# Patient Record
Sex: Female | Born: 1970 | Race: White | Hispanic: No | Marital: Married | Smoking: Former smoker
Health system: Southern US, Community
[De-identification: ages and names within clinical notes are randomized; demographics above are authoritative.]

## PROBLEM LIST (undated history)

## (undated) DIAGNOSIS — T7840XA Allergy, unspecified, initial encounter: Secondary | ICD-10-CM

## (undated) DIAGNOSIS — G43909 Migraine, unspecified, not intractable, without status migrainosus: Secondary | ICD-10-CM

## (undated) DIAGNOSIS — I10 Essential (primary) hypertension: Secondary | ICD-10-CM

## (undated) DIAGNOSIS — F329 Major depressive disorder, single episode, unspecified: Secondary | ICD-10-CM

## (undated) DIAGNOSIS — F419 Anxiety disorder, unspecified: Secondary | ICD-10-CM

## (undated) DIAGNOSIS — F32A Depression, unspecified: Secondary | ICD-10-CM

## (undated) HISTORY — PX: INCISION AND DRAINAGE ABSCESS: SHX5864

## (undated) HISTORY — PX: TONSILLECTOMY: SUR1361

## (undated) HISTORY — DX: Allergy, unspecified, initial encounter: T78.40XA

## (undated) HISTORY — PX: OTHER SURGICAL HISTORY: SHX169

## (undated) HISTORY — DX: Depression, unspecified: F32.A

## (undated) HISTORY — DX: Anxiety disorder, unspecified: F41.9

## (undated) HISTORY — DX: Major depressive disorder, single episode, unspecified: F32.9

## (undated) HISTORY — DX: Migraine, unspecified, not intractable, without status migrainosus: G43.909

## (undated) HISTORY — DX: Essential (primary) hypertension: I10

---

## 1998-10-05 ENCOUNTER — Other Ambulatory Visit: Admission: RE | Admit: 1998-10-05 | Discharge: 1998-10-05 | Payer: Self-pay | Admitting: Obstetrics and Gynecology

## 1999-05-04 ENCOUNTER — Encounter: Admission: RE | Admit: 1999-05-04 | Discharge: 1999-08-02 | Payer: Self-pay | Admitting: *Deleted

## 1999-10-14 ENCOUNTER — Other Ambulatory Visit: Admission: RE | Admit: 1999-10-14 | Discharge: 1999-10-14 | Payer: Self-pay | Admitting: Obstetrics and Gynecology

## 1999-12-30 ENCOUNTER — Encounter: Admission: RE | Admit: 1999-12-30 | Discharge: 1999-12-30 | Payer: Self-pay | Admitting: Sports Medicine

## 2000-07-10 ENCOUNTER — Encounter: Admission: RE | Admit: 2000-07-10 | Discharge: 2000-07-10 | Payer: Self-pay | Admitting: Family Medicine

## 2000-07-10 ENCOUNTER — Encounter: Payer: Self-pay | Admitting: Family Medicine

## 2000-11-22 ENCOUNTER — Other Ambulatory Visit: Admission: RE | Admit: 2000-11-22 | Discharge: 2000-11-22 | Payer: Self-pay | Admitting: Obstetrics and Gynecology

## 2001-03-07 ENCOUNTER — Encounter: Payer: Self-pay | Admitting: Obstetrics and Gynecology

## 2001-03-07 ENCOUNTER — Ambulatory Visit (HOSPITAL_COMMUNITY): Admission: RE | Admit: 2001-03-07 | Discharge: 2001-03-07 | Payer: Self-pay | Admitting: Obstetrics and Gynecology

## 2001-12-02 ENCOUNTER — Other Ambulatory Visit: Admission: RE | Admit: 2001-12-02 | Discharge: 2001-12-02 | Payer: Self-pay | Admitting: Obstetrics and Gynecology

## 2002-03-20 ENCOUNTER — Inpatient Hospital Stay (HOSPITAL_COMMUNITY): Admission: AD | Admit: 2002-03-20 | Discharge: 2002-03-22 | Payer: Self-pay | Admitting: Obstetrics and Gynecology

## 2002-03-26 ENCOUNTER — Encounter: Admission: RE | Admit: 2002-03-26 | Discharge: 2002-04-25 | Payer: Self-pay | Admitting: Obstetrics and Gynecology

## 2002-05-11 ENCOUNTER — Ambulatory Visit (HOSPITAL_COMMUNITY): Admission: RE | Admit: 2002-05-11 | Discharge: 2002-05-11 | Payer: Self-pay | Admitting: Orthopedic Surgery

## 2002-05-11 ENCOUNTER — Encounter: Payer: Self-pay | Admitting: Orthopedic Surgery

## 2002-05-23 ENCOUNTER — Ambulatory Visit (HOSPITAL_BASED_OUTPATIENT_CLINIC_OR_DEPARTMENT_OTHER): Admission: RE | Admit: 2002-05-23 | Discharge: 2002-05-23 | Payer: Self-pay | Admitting: Orthopedic Surgery

## 2002-12-29 ENCOUNTER — Other Ambulatory Visit: Admission: RE | Admit: 2002-12-29 | Discharge: 2002-12-29 | Payer: Self-pay | Admitting: Obstetrics and Gynecology

## 2004-05-04 ENCOUNTER — Other Ambulatory Visit: Admission: RE | Admit: 2004-05-04 | Discharge: 2004-05-04 | Payer: Self-pay | Admitting: Obstetrics and Gynecology

## 2005-01-11 ENCOUNTER — Observation Stay (HOSPITAL_COMMUNITY): Admission: AD | Admit: 2005-01-11 | Discharge: 2005-01-12 | Payer: Self-pay | Admitting: Otolaryngology

## 2005-02-17 ENCOUNTER — Emergency Department (HOSPITAL_COMMUNITY): Admission: EM | Admit: 2005-02-17 | Discharge: 2005-02-18 | Payer: Self-pay | Admitting: Emergency Medicine

## 2005-05-16 ENCOUNTER — Other Ambulatory Visit: Admission: RE | Admit: 2005-05-16 | Discharge: 2005-05-16 | Payer: Self-pay | Admitting: Obstetrics and Gynecology

## 2005-10-25 ENCOUNTER — Emergency Department (HOSPITAL_COMMUNITY): Admission: EM | Admit: 2005-10-25 | Discharge: 2005-10-25 | Payer: Self-pay | Admitting: Emergency Medicine

## 2006-05-25 ENCOUNTER — Other Ambulatory Visit: Admission: RE | Admit: 2006-05-25 | Discharge: 2006-05-25 | Payer: Self-pay | Admitting: Obstetrics and Gynecology

## 2006-12-06 ENCOUNTER — Inpatient Hospital Stay (HOSPITAL_COMMUNITY): Admission: AD | Admit: 2006-12-06 | Discharge: 2006-12-06 | Payer: Self-pay | Admitting: Obstetrics and Gynecology

## 2006-12-14 ENCOUNTER — Inpatient Hospital Stay (HOSPITAL_COMMUNITY): Admission: AD | Admit: 2006-12-14 | Discharge: 2006-12-16 | Payer: Self-pay | Admitting: Obstetrics and Gynecology

## 2012-05-06 ENCOUNTER — Other Ambulatory Visit: Payer: Self-pay | Admitting: Radiology

## 2012-05-06 ENCOUNTER — Encounter: Payer: Self-pay | Admitting: Obstetrics and Gynecology

## 2012-05-06 DIAGNOSIS — Z803 Family history of malignant neoplasm of breast: Secondary | ICD-10-CM

## 2012-05-06 DIAGNOSIS — N6453 Retraction of nipple: Secondary | ICD-10-CM

## 2012-05-19 ENCOUNTER — Ambulatory Visit
Admission: RE | Admit: 2012-05-19 | Discharge: 2012-05-19 | Disposition: A | Discharge status: Home / Self Care | Payer: BC Managed Care – PPO | Source: Ambulatory Visit | Attending: Radiology | Admitting: Radiology

## 2012-05-19 DIAGNOSIS — Z803 Family history of malignant neoplasm of breast: Secondary | ICD-10-CM

## 2012-05-19 DIAGNOSIS — N6453 Retraction of nipple: Secondary | ICD-10-CM

## 2012-05-19 MED ORDER — GADOBENATE DIMEGLUMINE 529 MG/ML IV SOLN
12.0000 mL | Freq: Once | INTRAVENOUS | Status: AC | PRN
  Administered 2012-05-19: 12 mL via INTRAVENOUS

## 2012-05-20 ENCOUNTER — Other Ambulatory Visit: Payer: Self-pay

## 2012-05-21 ENCOUNTER — Other Ambulatory Visit: Payer: Self-pay

## 2012-11-07 ENCOUNTER — Encounter: Payer: Self-pay | Admitting: Obstetrics and Gynecology

## 2012-11-07 ENCOUNTER — Ambulatory Visit (INDEPENDENT_AMBULATORY_CARE_PROVIDER_SITE_OTHER): Payer: BC Managed Care – PPO | Admitting: Obstetrics and Gynecology

## 2012-11-07 VITALS — BP 112/70 | Ht 62.75 in | Wt 130.0 lb

## 2012-11-07 DIAGNOSIS — F32A Depression, unspecified: Secondary | ICD-10-CM | POA: Insufficient documentation

## 2012-11-07 DIAGNOSIS — N6453 Retraction of nipple: Secondary | ICD-10-CM | POA: Insufficient documentation

## 2012-11-07 DIAGNOSIS — F419 Anxiety disorder, unspecified: Secondary | ICD-10-CM | POA: Insufficient documentation

## 2012-11-07 DIAGNOSIS — F329 Major depressive disorder, single episode, unspecified: Secondary | ICD-10-CM | POA: Insufficient documentation

## 2012-11-07 DIAGNOSIS — G43909 Migraine, unspecified, not intractable, without status migrainosus: Secondary | ICD-10-CM | POA: Insufficient documentation

## 2012-11-07 DIAGNOSIS — N6459 Other signs and symptoms in breast: Secondary | ICD-10-CM

## 2012-11-07 DIAGNOSIS — Z803 Family history of malignant neoplasm of breast: Secondary | ICD-10-CM | POA: Insufficient documentation

## 2012-11-07 NOTE — Progress Notes (Signed)
Subjective:    Heather Copeland is a 42 y.o. female, No obstetric history on file., who presents for an annual exam.    Last Pap: 11/07/2011 WNL: Yes HPV- not detected Regular Periods:yes Contraception: condoms  Monthly Breast exam:yes Tetanus<4yrs:? Nl.Bladder Function:yes Daily BMs:yes Healthy Diet:yes Calcium:no Mammogram:yes Date of Mammogram: 03/2012 Exercise:no Have often Exercise:  Seatbelt: yes Abuse at home: no Stressful work:no Sigmoid-colonoscopy: n./a Bone Density: No PCP: Dr.Avva Change in PMH: Right breast MRI-wnl-inverted nipple 04/2012 Change in FMH:no change BMI-23  History   Social History  . Marital Status: Single    Spouse Name: N/A    Number of Children: N/A  . Years of Education: N/A   Social History Main Topics  . Smoking status: Never Smoker   . Smokeless tobacco: Never Used  . Alcohol Use: None  . Drug Use: None  . Sexually Active: None   Other Topics Concern  . None   Social History Narrative  . None    Menstrual cycle:   LMP: Patient's last menstrual period was 10/24/2012.           Cycle: Fairly regular with occasional episodes of normal menses in 1 day of no bleeding and one day following.  This has occurred a few times in the last 6 months  The following portions of the patient's history were reviewed and updated as appropriate: allergies, current medications, past family history, past medical history, past social history, past surgical history and problem list.  Review of Systems Pertinent items are noted in HPI. Breast:Negative for breast lump,nipple discharge.  Nipple retraction has been worked up Gastrointestinal: Negative for abdominal pain, change in bowel habits or rectal bleeding Urinary:negative   Objective:    BP 112/70  Ht 5' 2.75" (1.594 m)  Wt 130 lb (58.968 kg)  BMI 23.21 kg/m2  LMP 10/24/2012    Weight:  Wt Readings from Last 1 Encounters:  11/07/12 130 lb (58.968 kg)          BMI: Body mass index is  23.21 kg/(m^2).  General Appearance: Alert, appropriate appearance for age. No acute distress HEENT: Grossly normal Neck / Thyroid: Supple, no masses, nodes or enlargement Lungs: clear to auscultation bilaterally Back: No CVA tenderness Breast Exam: nipple retraction / inversion, right and No masses or nodes.No dimpling, nipple retraction or discharge. Cardiovascular: Regular rate and rhythm. S1, S2, no murmur Gastrointestinal: Soft, non-tender, no masses or organomegaly Pelvic Exam: Vulva and vagina appear normal. Bimanual exam reveals normal uterus and adnexa. Rectovaginal: normal rectal, no masses Lymphatic Exam: Non-palpable nodes in neck, clavicular, axillary, or inguinal regions Skin: no rash or abnormalities Neurologic: Normal gait and speech, no tremor  Psychiatric: Alert and oriented, appropriate affect.   Wet Prep:not applicable Urinalysis:not applicable UPT: Not done   Assessment:    Persistent right nipple retraction, with a full breast workup, including MRI, but no etiology found    Plan:    Mammogram at least annually with more frequent followup as indicated pap smear due 2016 return annually or prn STD screening: declined Contraception:condoms   Dierdre Forth MD

## 2012-11-14 ENCOUNTER — Telehealth: Payer: Self-pay | Admitting: Obstetrics and Gynecology

## 2012-11-14 NOTE — Telephone Encounter (Signed)
vph pt 

## 2012-11-14 NOTE — Telephone Encounter (Signed)
Heather Copeland to address.

## 2012-12-10 ENCOUNTER — Telehealth: Payer: Self-pay

## 2012-12-10 NOTE — Telephone Encounter (Signed)
Per AVS call pt to f/u on missed mammogram appt. Pt was called today  Pt stated Mammogram was canceled b/c of child care issues. Pt stated she has already R/S mammogram for this Friday 12/13/2012.  [facility] CMA

## 2013-01-28 ENCOUNTER — Other Ambulatory Visit: Payer: Self-pay | Admitting: Neurosurgery

## 2013-01-28 DIAGNOSIS — M47812 Spondylosis without myelopathy or radiculopathy, cervical region: Secondary | ICD-10-CM

## 2013-02-08 ENCOUNTER — Other Ambulatory Visit: Payer: BC Managed Care – PPO

## 2013-02-15 ENCOUNTER — Ambulatory Visit
Admission: RE | Admit: 2013-02-15 | Discharge: 2013-02-15 | Disposition: A | Discharge status: Home / Self Care | Payer: BC Managed Care – PPO | Source: Ambulatory Visit | Attending: Neurosurgery | Admitting: Neurosurgery

## 2013-02-15 DIAGNOSIS — M47812 Spondylosis without myelopathy or radiculopathy, cervical region: Secondary | ICD-10-CM

## 2014-08-31 ENCOUNTER — Encounter: Payer: Self-pay | Admitting: Obstetrics and Gynecology

## 2014-11-01 ENCOUNTER — Ambulatory Visit (INDEPENDENT_AMBULATORY_CARE_PROVIDER_SITE_OTHER): Payer: BC Managed Care – PPO | Admitting: Physician Assistant

## 2014-11-01 VITALS — BP 118/79 | HR 80 | Temp 97.9°F | Resp 19 | Ht 63.0 in | Wt 122.6 lb

## 2014-11-01 DIAGNOSIS — H8112 Benign paroxysmal vertigo, left ear: Secondary | ICD-10-CM

## 2014-11-01 MED ORDER — MECLIZINE HCL 32 MG PO TABS: 32.0000 mg | ORAL_TABLET | Freq: Three times a day (TID) | ORAL | Status: DC | PRN

## 2014-11-01 NOTE — Progress Notes (Signed)
Subjective:    Patient ID: Heather Copeland, female    DOB: 03-Jun-1971, 44 y.o.   MRN: [id number]  PCP: Tivis Ringer, MD  Chief Complaint  Patient presents with  . Dizziness    since 5am this am, not any better after sleeping for a while, has taken 25mg  phenergan, but has not helped   . Headache  . Ear Fullness    both ears   Patient Active Problem List   Diagnosis Date Noted  . Family history of breast cancer 11/07/2012  . Nipple retraction 11/07/2012  . Migraines   . Anxiety   . Depression    Prior to Admission medications   Medication Sig Start Date End Date Taking? Authorizing Provider  b complex vitamins tablet Take 1 tablet by mouth daily.   Yes Historical Provider, MD  fluticasone (FLONASE) 50 MCG/ACT nasal spray Place 2 sprays into the nose daily.   Yes Historical Provider, MD  Loratadine (CLARITIN PO) Take 1 tablet by mouth daily.   Yes Historical Provider, MD  ramipril (ALTACE) 2.5 MG tablet Take 2.5 mg by mouth daily.   Yes Historical Provider, MD  SUMAtriptan (IMITREX) 100 MG tablet Take 100 mg by mouth every 2 (two) hours as needed.   Yes Historical Provider, MD  meclizine (ANTIVERT) 32 MG tablet Take 1 tablet (32 mg total) by mouth 3 (three) times daily as needed. 11/01/14   Araceli Bouche, PA   Medications, allergies, past medical history, surgical history, family history, social history and problem list reviewed and updated.  HPI  72 yof with no pertinent pmh presents to clinic with sudden onset vertigo that began at 5am today upon awakening.   Vertigo immediately after waking this am. Was barely able to get out of bed to use restroom. Eventually made it back to bed and went back to sleep until 8am. Vertigo has mostly persisted since then. It does get better or worse depending on certain head positions but is always somewhat there. She took a 25 mg phenergan which didn't seem to help. She has felt as if the room was spinning during episodes. She did not feel  weak or lightheaded.   No assoc N/V, vision changes, weakness, numbness, CP, SOB, palps. No recent uri sx, no assoc tinnitus or hearing loss. She has eaten today and is holding food down.   Dad had hx cva at age 103. Pt had mri at that time (in her 60s). No malformations or abnormalities per pt.   Review of Systems No fever, chills.     Objective:   Physical Exam  Constitutional: She is oriented to person, place, and time. She appears well-developed and well-nourished.  Non-toxic appearance. She does not have a sickly appearance. She does not appear ill. No distress.  BP 118/79 mmHg  Pulse 80  Temp(Src) 97.9 F (36.6 C) (Oral)  Resp 19  Ht 5\' 3"  (1.6 m)  Wt 122 lb 9.6 oz (55.611 kg)  BMI 21.72 kg/m2  SpO2 100%   HENT:  Right Ear: Tympanic membrane normal.  Left Ear: Tympanic membrane normal.  Eyes: Conjunctivae and EOM are normal. Pupils are equal, round, and reactive to light.  Neck: Normal range of motion.  Cardiovascular: Normal rate, regular rhythm, S1 normal, S2 normal and normal heart sounds.  Exam reveals no gallop.   No murmur heard. Pulmonary/Chest: Effort normal and breath sounds normal. No respiratory distress.  Neurological: She is alert and oriented to person, place, and time. She has normal strength.  No cranial nerve deficit or sensory deficit. She displays a negative Romberg sign. Coordination and gait normal.  Reflex Scores:      Patellar reflexes are 2+ on the right side and 2+ on the left side. Skin: Skin is warm, dry and intact. No rash noted.  Psychiatric: She has a normal mood and affect. Her speech is normal.      Assessment & Plan:   85 yof with no pertinent pmh presents to clinic with sudden onset vertigo that began at 5am today upon awakening.   Benign paroxysmal positional vertigo, left - Plan: meclizine (ANTIVERT) 32 MG tablet --doubt cva/tia with normal neuro exam  --doubt meneires with lack of tinnitus or hearing loss --most likely bppv as sx  have been intermittent since onset earlier this am, benign exam today, no recent uri to suggest vestibular neuritis, and reproducible sx with dix-halpike testing --info on epley maneuver --meclizine tid for 2-3 days, then prn --rtc 5 days if sx not resolved for further workup  Julieta Gutting, PA-C Physician Assistant-Certified [facility]  11/01/2014 3:09 PM

## 2014-11-01 NOTE — Patient Instructions (Signed)
Please take the meclizine three times per day for the 1st 2-3 days. This should resolve your vertigo. After that please take the medicine as needed up to three times daily. If your symptoms are still present in 5 days please return to clinic for further evaluation.   Epley Maneuver Self-Care WHAT IS THE EPLEY MANEUVER? The Epley maneuver is an exercise you can do to relieve symptoms of benign paroxysmal positional vertigo (BPPV). This condition is often just referred to as vertigo. BPPV is caused by the movement of tiny crystals (canaliths) inside your inner ear. The accumulation and movement of canaliths in your inner ear causes a sudden spinning sensation (vertigo) when you move your head to certain positions. Vertigo usually lasts about 30 seconds. BPPV usually occurs in just one ear. If you get vertigo when you lie on your left side, you probably have BPPV in your left ear. Your health care provider can tell you which ear is involved.  BPPV may be caused by a head injury. Many people older than 50 get BPPV for unknown reasons. If you have been diagnosed with BPPV, your health care provider may teach you how to do this maneuver. BPPV is not life threatening (benign) and usually goes away in time.  WHEN SHOULD I PERFORM THE EPLEY MANEUVER? You can do this maneuver at home whenever you have symptoms of vertigo. You may do the Epley maneuver up to 3 times a day until your symptoms of vertigo go away. HOW SHOULD I DO THE EPLEY MANEUVER? 1. Sit on the edge of a bed or table with your back straight. Your legs should be extended or hanging over the edge of the bed or table.  2. Turn your head halfway toward the affected ear.  3. Lie backward quickly with your head turned until you are lying flat on your back. You may want to position a pillow under your shoulders.  4. Hold this position for 30 seconds. You may experience an attack of vertigo. This is normal. Hold this position until the vertigo  stops. 5. Then turn your head to the opposite direction until your unaffected ear is facing the floor.  6. Hold this position for 30 seconds. You may experience an attack of vertigo. This is normal. Hold this position until the vertigo stops. 7. Now turn your whole body to the same side as your head. Hold for another 30 seconds.  8. You can then sit back up. ARE THERE RISKS TO THIS MANEUVER? In some cases, you may have other symptoms (such as changes in your vision, weakness, or numbness). If you have these symptoms, stop doing the maneuver and call your health care provider. Even if doing these maneuvers relieves your vertigo, you may still have dizziness. Dizziness is the sensation of light-headedness but without the sensation of movement. Even though the Epley maneuver may relieve your vertigo, it is possible that your symptoms will return within 5 years. WHAT SHOULD I DO AFTER THIS MANEUVER? After doing the Epley maneuver, you can return to your normal activities. Ask your doctor if there is anything you should do at home to prevent vertigo. This may include:  Sleeping with two or more pillows to keep your head elevated.  Not sleeping on the side of your affected ear.  Getting up slowly from bed.  Avoiding sudden movements during the day.  Avoiding extreme head movement, like looking up or bending over.  Wearing a cervical collar to prevent sudden head movements. WHAT SHOULD  I DO IF MY SYMPTOMS GET WORSE? Call your health care provider if your vertigo gets worse. Call your provider right way if you have other symptoms, including:   Nausea.  Vomiting.  Headache.  Weakness.  Numbness.  Vision changes. Document Released: 10/21/2013 Document Reviewed: 10/21/2013 [facility] Patient Information 2015 [place], [place]. This information is not intended to replace advice given to you by your health care provider. Make sure you discuss any questions you have with your health care  provider. Benign Positional Vertigo Vertigo means you feel like you or your surroundings are moving when they are not. Benign positional vertigo is the most common form of vertigo. Benign means that the cause of your condition is not serious. Benign positional vertigo is more common in older adults. CAUSES  Benign positional vertigo is the result of an upset in the labyrinth system. This is an area in the middle ear that helps control your balance. This may be caused by a viral infection, head injury, or repetitive motion. However, often no specific cause is found. SYMPTOMS  Symptoms of benign positional vertigo occur when you move your head or eyes in different directions. Some of the symptoms may include: 9. Loss of balance and falls. 10. Vomiting. 11. Blurred vision. 12. Dizziness. 13. Nausea. 14. Involuntary eye movements (nystagmus). DIAGNOSIS  Benign positional vertigo is usually diagnosed by physical exam. If the specific cause of your benign positional vertigo is unknown, your caregiver may perform imaging tests, such as magnetic resonance imaging (MRI) or computed tomography (CT). TREATMENT  Your caregiver may recommend movements or procedures to correct the benign positional vertigo. Medicines such as meclizine, benzodiazepines, and medicines for nausea may be used to treat your symptoms. In rare cases, if your symptoms are caused by certain conditions that affect the inner ear, you may need surgery. HOME CARE INSTRUCTIONS   Follow your caregiver's instructions.  Move slowly. Do not make sudden body or head movements.  Avoid driving.  Avoid operating heavy machinery.  Avoid performing any tasks that would be dangerous to you or others during a vertigo episode.  Drink enough fluids to keep your urine clear or pale yellow. SEEK IMMEDIATE MEDICAL CARE IF:   You develop problems with walking, weakness, numbness, or using your arms, hands, or legs.  You have difficulty  speaking.  You develop severe headaches.  Your nausea or vomiting continues or gets worse.  You develop visual changes.  Your family or friends notice any behavioral changes.  Your condition gets worse.  You have a fever.  You develop a stiff neck or sensitivity to light. MAKE SURE YOU:   Understand these instructions.  Will watch your condition.  Will get help right away if you are not doing well or get worse. Document Released: 07/24/2006 Document Revised: 01/08/2012 Document Reviewed: 07/06/2011 [facility] Patient Information 2015 [place], [place]. This information is not intended to replace advice given to you by your health care provider. Make sure you discuss any questions you have with your health care provider.

## 2014-11-14 ENCOUNTER — Ambulatory Visit (INDEPENDENT_AMBULATORY_CARE_PROVIDER_SITE_OTHER): Payer: BC Managed Care – PPO | Admitting: Family Medicine

## 2014-11-14 ENCOUNTER — Encounter: Payer: Self-pay | Admitting: Family Medicine

## 2014-11-14 VITALS — BP 140/76 | HR 100 | Temp 98.1°F | Resp 16

## 2014-11-14 DIAGNOSIS — R079 Chest pain, unspecified: Secondary | ICD-10-CM

## 2014-11-14 DIAGNOSIS — F410 Panic disorder [episodic paroxysmal anxiety] without agoraphobia: Secondary | ICD-10-CM

## 2014-11-14 NOTE — Progress Notes (Signed)
° °  Subjective:    Patient ID: Heather Copeland, female    DOB: [birth year], 44 y.o.   MRN: [id number] This chart was scribed for Heather Haber, Copeland by Marti Sleigh, Medical Scribe. This patient was seen in Room 7 and the patient's care was started a 2:36 PM.  Chief Complaint  Patient presents with   numbess and tingling in left arm    worried about heart.  also took a quarter of husbands 95m vailum   pressure in back    HPI HPI Comments: LVICTORINA KABLEis a 44y.o. female [facility] very anxious, flushed, dizzy, panicked and nauseous, as well as having numbness in her left arm. Pt states that she was sitting in her car this morning and became extremely anxious and flushed, and experienced numbness in her left arm. Pt states she went out to lunch with her mother and felt less anxious, but the numbness in her arm continued so [facility] Pt states she has had additional stressors at home, including a husband with a chronic illness (DM) and financial stressors. Pt reports a family hx of coronary disease, and stroke, and states that she is worried about her heart. Pt reports that she has a twin sister with anxiety problems.   Pt states she met with her PCP (Dr. ARadene Gunning several days ago for these sx  and during that meeting they discussed the possibility that she was having panic attacks. Pt denies current CP or SOB. Pt states she experienced SOB last night during one of these attacks. Pt denies leg swelling or pain. Pt states she took 1/4 valium earlier today while she was having an attack. Pt endorses having chronic migraines. Pt states she has been on prozac in the past (two years ago), and was restarted on prozac this week.    Review of Systems  Constitutional: Negative for fever and chills.  Respiratory: Negative for shortness of breath.   Cardiovascular: Negative for chest pain.  Neurological: Positive for dizziness, numbness  and headaches.       Objective:   Physical Exam  Constitutional: She is oriented to person, place, and time. She appears well-developed and well-nourished.  HENT:  Head: Normocephalic and atraumatic.  Eyes: Pupils are equal, round, and reactive to light.  Neck: Neck supple.  Cardiovascular: Normal rate and regular rhythm.   Pulmonary/Chest: Effort normal and breath sounds normal. No respiratory distress.  Neurological: She is alert and oriented to person, place, and time.  Skin: Skin is warm and dry.  Nursing note and vitals reviewed.  Long conversation with patient about recent panic attacks and chronic anxiety.     EKG: Normal sinus rhythm Assessment & Plan:   This chart was scribed in my presence and reviewed by me personally.    ICD-9-CM ICD-10-CM   1. Chest pain, unspecified chest pain type 786.50 R07.9 EKG 12-Lead     Comprehensive metabolic panel     T4, Free     TSH  2. Panic disorder 300.01 F41.0 Comprehensive metabolic panel     T4, Free     TSH   I have advised patient to increase her Prozac to 20 mg daily and follow-up with Dr. DLauna Flight  Signed, Heather Copeland

## 2014-11-14 NOTE — Addendum Note (Signed)
Addended by: Kem Boroughs D on: 11/14/2014 03:22 PM   Modules accepted: Level of Service

## 2014-11-14 NOTE — Patient Instructions (Signed)
Please discuss this with Dr. Launa Flight    Panic Attacks Panic attacks are sudden, short-livedsurges of severe anxiety, fear, or discomfort. They may occur for no reason when you are relaxed, when you are anxious, or when you are sleeping. Panic attacks may occur for a number of reasons:   Healthy people occasionally have panic attacks in extreme, life-threatening situations, such as war or natural disasters. Normal anxiety is a protective mechanism of the body that helps [place] react to danger (fight or flight response).  Panic attacks are often seen with anxiety disorders, such as panic disorder, social anxiety disorder, generalized anxiety disorder, and phobias. Anxiety disorders cause excessive or uncontrollable anxiety. They may interfere with your relationships or other life activities.  Panic attacks are sometimes seen with other mental illnesses, such as depression and posttraumatic stress disorder.  Certain medical conditions, prescription medicines, and drugs of abuse can cause panic attacks. SYMPTOMS  Panic attacks start suddenly, peak within 20 minutes, and are accompanied by four or more of the following symptoms:  Pounding heart or fast heart rate (palpitations).  Sweating.  Trembling or shaking.  Shortness of breath or feeling smothered.  Feeling choked.  Chest pain or discomfort.  Nausea or strange feeling in your stomach.  Dizziness, light-headedness, or feeling like you will faint.  Chills or hot flushes.  Numbness or tingling in your lips or hands and feet.  Feeling that things are not real or feeling that you are not yourself.  Fear of losing control or going crazy.  Fear of dying. Some of these symptoms can mimic serious medical conditions. For example, you may think you are having a heart attack. Although panic attacks can be very scary, they are not life threatening. DIAGNOSIS  Panic attacks are diagnosed through an assessment by your health care  provider. Your health care provider will ask questions about your symptoms, such as where and when they occurred. Your health care provider will also ask about your medical history and use of alcohol and drugs, including prescription medicines. Your health care provider may order blood tests or other studies to rule out a serious medical condition. Your health care provider may refer you to a mental health professional for further evaluation. TREATMENT   Most healthy people who have one or two panic attacks in an extreme, life-threatening situation will not require treatment.  The treatment for panic attacks associated with anxiety disorders or other mental illness typically involves counseling with a mental health professional, medicine, or a combination of both. Your health care provider will help determine what treatment is best for you.  Panic attacks due to physical illness usually go away with treatment of the illness. If prescription medicine is causing panic attacks, talk with your health care provider about stopping the medicine, decreasing the dose, or substituting another medicine.  Panic attacks due to alcohol or drug abuse go away with abstinence. Some adults need professional help in order to stop drinking or using drugs. HOME CARE INSTRUCTIONS   Take all medicines as directed by your health care provider.   Schedule and attend follow-up visits as directed by your health care provider. It is important to keep all your appointments. SEEK MEDICAL CARE IF:  You are not able to take your medicines as prescribed.  Your symptoms do not improve or get worse. SEEK IMMEDIATE MEDICAL CARE IF:   You experience panic attack symptoms that are different than your usual symptoms.  You have serious thoughts about hurting yourself or  others.  You are taking medicine for panic attacks and have a serious side effect. MAKE SURE YOU:  Understand these instructions.  Will watch your  condition.  Will get help right away if you are not doing well or get worse. Document Released: 10/16/2005 Document Revised: 10/21/2013 Document Reviewed: 05/30/2013 [facility] Patient Information 2015 [place], [place]. This information is not intended to replace advice given to you by your health care provider. Make sure you discuss any questions you have with your health care provider.

## 2014-11-15 LAB — COMPREHENSIVE METABOLIC PANEL
ALT: 21 U/L (ref 0–35)
AST: 26 U/L (ref 0–37)
Albumin: 4.1 g/dL (ref 3.5–5.2)
Alkaline Phosphatase: 56 U/L (ref 39–117)
BUN: 17 mg/dL (ref 6–23)
CO2: 25 mEq/L (ref 19–32)
Calcium: 9.4 mg/dL (ref 8.4–10.5)
Chloride: 103 mEq/L (ref 96–112)
Creat: 0.8 mg/dL (ref 0.50–1.10)
Glucose, Bld: 90 mg/dL (ref 70–99)
Potassium: 4.3 mEq/L (ref 3.5–5.3)
Sodium: 137 mEq/L (ref 135–145)
Total Bilirubin: 0.3 mg/dL (ref 0.2–1.2)
Total Protein: 7.5 g/dL (ref 6.0–8.3)

## 2014-11-15 LAB — T4, FREE: Free T4: 0.89 ng/dL (ref 0.80–1.80)

## 2014-11-15 LAB — TSH: TSH: 0.836 u[IU]/mL (ref 0.350–4.500)

## 2016-01-23 ENCOUNTER — Ambulatory Visit (INDEPENDENT_AMBULATORY_CARE_PROVIDER_SITE_OTHER): Payer: BC Managed Care – PPO | Admitting: Physician Assistant

## 2016-01-23 VITALS — BP 124/82 | HR 72 | Temp 98.2°F | Resp 18

## 2016-01-23 DIAGNOSIS — S61211A Laceration without foreign body of left index finger without damage to nail, initial encounter: Secondary | ICD-10-CM

## 2016-01-23 DIAGNOSIS — S61219A Laceration without foreign body of unspecified finger without damage to nail, initial encounter: Secondary | ICD-10-CM

## 2016-01-23 NOTE — Progress Notes (Signed)
   01/23/2016 11:19 AM   DOB: 1971-09-16 / MRN: [id number]  SUBJECTIVE:  Heather Copeland is a 45 y.o. female presenting for a laceration to the left index finger that occurred while slinicng some herbs this morning.  Denies change in function of the finger.  Has had a difficulty time achieving hemostasis.  She does not take blood thinners.      She is allergic to sulfa antibiotics.   She  has a past medical history of Migraines; Anxiety; Depression; Allergy; and Migraines.    She  reports that she has never smoked. She has never used smokeless tobacco. She  has no sexual activity history on file. The patient  has past surgical history that includes Disc fusion (3 yrs ago); Tonsillectomy (35yrs ago); Incision and drainage abscess; and C yst removal (10 yrs ago).  Her family history includes Anxiety disorder in her mother and sister; Breast cancer in her maternal grandmother; Depression in her mother and sister; Heart attack in her father; Heart disease in her maternal grandmother; Hypertension in her mother; Stroke in her maternal grandfather; Stroke (age of onset: 32) in her father.  Review of Systems  Constitutional: Negative for fever.  Gastrointestinal: Negative for nausea.  Skin: Negative for rash.  Neurological: Negative for dizziness and headaches.  Psychiatric/Behavioral: Negative for depression.    Problem list and medications reviewed and updated by myself where necessary, and exist elsewhere in the encounter.   OBJECTIVE:  BP 124/82 mmHg  Pulse 72  Temp(Src) 98.2 F (36.8 C) (Oral)  Resp 18  SpO2 96%  LMP 12/27/2015 (Approximate)  Physical Exam  Constitutional: She is oriented to person, place, and time. She appears well-developed and well-nourished.  Cardiovascular: Normal rate and regular rhythm.   Pulmonary/Chest: Effort normal and breath sounds normal.  Musculoskeletal:       Hands: Neurological: She is alert and oriented to person, place, and time.   Risk and  benefits discussed and verbal consent obtained. Anesthetic allergies reviewed. Patient anesthetized via digital nerve block using 1:1 mix of 2% lidocaine without epi and Marcaine. The wound was cleansed thoroughly with soap and water. Sterile prep and drape. Wound closed with 4 SI throws using 5-0 Ethilon suture material. Hemostasis achieved. Mupirocin applied to the wound and bandage placed. The patient tolerated well. Wound instructions were provided and the patient is to return in 10 days for suture removal.   No results found for this or any previous visit (from the past 72 hour(s)).  No results found.  ASSESSMENT AND PLAN  Heather Copeland was seen today for laceration.  Diagnoses and all orders for this visit:  Finger laceration, initial encounter: Repaired without complication. Anticipatory guidance provided via AVS.  RTC in 10 days for suture removal. She reports she her TDAP is current through her PCP. I have advised that she call and check on this tomorrow.      The patient was advised to call or return to clinic if she does not see an improvement in symptoms or to seek the care of the closest emergency department if she worsens with the above plan.   Philis Fendt, MHS, PA-C [facility] 01/23/2016 11:19 AM

## 2016-01-23 NOTE — Patient Instructions (Addendum)
  Take 600 mg Ibuprofen every 6-8 hours for pain as needed.  Please leave the bandage on for 48 hours. If you have exquisite pain, pus, redness or streaking up the hand and arm please call the office. Please return in ten days for suture removal.  Please check with your primary care doctor about your last tetanus shot.     IF you received an x-ray today, you will receive an invoice from [facility]. Please contact [facility] at [phone] with questions or concerns regarding your invoice.   IF you received labwork today, you will receive an invoice from [employer]. Please contact [facility] at [phone] with questions or concerns regarding your invoice.   Our billing staff will not be able to assist you with questions regarding bills from these companies.  You will be contacted with the lab results as soon as they are available. The fastest way to get your results is to activate your My Chart account. Instructions are located on the last page of this paperwork. If you have not heard from [place] regarding the results in 2 weeks, please contact this office.

## 2016-01-26 ENCOUNTER — Ambulatory Visit (INDEPENDENT_AMBULATORY_CARE_PROVIDER_SITE_OTHER): Payer: BC Managed Care – PPO | Admitting: Family Medicine

## 2016-01-26 ENCOUNTER — Telehealth: Payer: Self-pay

## 2016-01-26 VITALS — BP 118/68 | HR 81 | Temp 98.1°F | Resp 16 | Ht 63.0 in | Wt 122.0 lb

## 2016-01-26 DIAGNOSIS — L089 Local infection of the skin and subcutaneous tissue, unspecified: Secondary | ICD-10-CM

## 2016-01-26 DIAGNOSIS — T148XXA Other injury of unspecified body region, initial encounter: Principal | ICD-10-CM

## 2016-01-26 DIAGNOSIS — Z23 Encounter for immunization: Secondary | ICD-10-CM

## 2016-01-26 DIAGNOSIS — S61201A Unspecified open wound of left index finger without damage to nail, initial encounter: Secondary | ICD-10-CM | POA: Diagnosis not present

## 2016-01-26 MED ORDER — AMOXICILLIN-POT CLAVULANATE 875-125 MG PO TABS
1.0000 | ORAL_TABLET | Freq: Two times a day (BID) | ORAL | Status: DC
Start: 2016-01-26 — End: 2021-02-18

## 2016-01-26 NOTE — Telephone Encounter (Signed)
Pt needs to talk with someone about her stitches she recently got -she believes now that her finger is infected  Best number [phone]

## 2016-01-26 NOTE — Progress Notes (Signed)
45 yo woman who cut left index finger on a kitchen knife on Sunday and today, after suture repair on Sunday, she awoke with distal phalanx being red and very tender.  She had kept the finger bandaged until last night  She works at [employer] last dT.  She called her PCP Dr. Dagmar Hait to see about this.  Objective:  BP 118/68 mmHg  Pulse 81  Temp(Src) 98.1 F (36.7 C) (Oral)  Resp 16  Ht 5\' 3"  (1.6 m)  Wt 122 lb (55.339 kg)  BMI 21.62 kg/m2  SpO2 98%  LMP 12/27/2015 (Approximate) Distal phalanx shows bright red erythema the entire distal phalanx of pointer finger.  Sutures intact with no fluctuance or drainage.  Assessment:  Wound infection  Plan:  Post-traumatic wound infection ([place]) - Plan: amoxicillin-clavulanate (AUGMENTIN) 875-125 MG tablet, Tdap vaccine greater than or equal to 7yo IM  Robyn Haber,  MD

## 2016-01-26 NOTE — Patient Instructions (Addendum)
Return on Saturday, January 29, 2016  Tetanus shot given today  Take the Augmentin with food and continue the probiotic.

## 2016-01-26 NOTE — Telephone Encounter (Signed)
Pt believes that her finger has gotten infected from having stitches recently And was told to call  Best number [phone]

## 2016-01-26 NOTE — Telephone Encounter (Signed)
Pt came in.

## 2017-03-01 ENCOUNTER — Ambulatory Visit: Payer: BC Managed Care – PPO | Admitting: Sports Medicine

## 2019-06-09 ENCOUNTER — Other Ambulatory Visit: Payer: Self-pay | Admitting: Neurosurgery

## 2019-06-09 DIAGNOSIS — S129XXA Fracture of neck, unspecified, initial encounter: Secondary | ICD-10-CM

## 2019-06-19 ENCOUNTER — Other Ambulatory Visit: Payer: Self-pay

## 2019-06-19 ENCOUNTER — Ambulatory Visit
Admission: RE | Admit: 2019-06-19 | Discharge: 2019-06-19 | Disposition: A | Discharge status: Home / Self Care | Payer: BC Managed Care – PPO | Source: Ambulatory Visit | Attending: Neurosurgery | Admitting: Neurosurgery

## 2019-06-19 DIAGNOSIS — S129XXA Fracture of neck, unspecified, initial encounter: Secondary | ICD-10-CM

## 2021-01-11 ENCOUNTER — Encounter: Payer: Self-pay | Admitting: Gastroenterology

## 2021-02-18 ENCOUNTER — Other Ambulatory Visit: Payer: Self-pay

## 2021-02-18 ENCOUNTER — Ambulatory Visit (AMBULATORY_SURGERY_CENTER): Payer: Self-pay

## 2021-02-18 VITALS — Ht 62.0 in | Wt 128.6 lb

## 2021-02-18 DIAGNOSIS — Z1211 Encounter for screening for malignant neoplasm of colon: Secondary | ICD-10-CM

## 2021-02-18 NOTE — Progress Notes (Signed)

## 2021-03-03 ENCOUNTER — Encounter: Payer: Self-pay | Admitting: Gastroenterology

## 2021-03-04 ENCOUNTER — Ambulatory Visit (AMBULATORY_SURGERY_CENTER): Payer: BC Managed Care – PPO | Admitting: Gastroenterology

## 2021-03-04 ENCOUNTER — Encounter: Payer: Self-pay | Admitting: Gastroenterology

## 2021-03-04 ENCOUNTER — Other Ambulatory Visit: Payer: Self-pay

## 2021-03-04 VITALS — BP 128/80 | HR 71 | Temp 96.8°F | Resp 20 | Ht 62.0 in | Wt 128.6 lb

## 2021-03-04 DIAGNOSIS — Z1211 Encounter for screening for malignant neoplasm of colon: Secondary | ICD-10-CM

## 2021-03-04 DIAGNOSIS — D122 Benign neoplasm of ascending colon: Secondary | ICD-10-CM

## 2021-03-04 MED ORDER — SODIUM CHLORIDE 0.9 % IV SOLN: 500.0000 mL | Freq: Once | INTRAVENOUS | Status: DC

## 2021-03-04 NOTE — Op Note (Signed)
[facility] Patient Name: Heather Copeland Procedure Date: 03/04/2021 7:30 AM MRN: [id number] Endoscopist: Jackquline Denmark , MD Age: 50 Referring MD:  Date of Birth: [birth year] Gender: Female Account #: [id number] Procedure:                Colonoscopy Indications:              Screening for colorectal malignant neoplasm Medicines:                Monitored Anesthesia Care Procedure:                Pre-Anesthesia Assessment:                           - Prior to the procedure, a History and Physical                            was performed, and patient medications and                            allergies were reviewed. The patient's tolerance of                            previous anesthesia was also reviewed. The risks                            and benefits of the procedure and the sedation                            options and risks were discussed with the patient.                            All questions were answered, and informed consent                            was obtained. Prior Anticoagulants: The patient has                            taken no previous anticoagulant or antiplatelet                            agents. ASA Grade Assessment: II - A patient with                            mild systemic disease. After reviewing the risks                            and benefits, the patient was deemed in                            satisfactory condition to undergo the procedure.                           After obtaining informed consent, the colonoscope  was passed under direct vision. Throughout the                            procedure, the patient's blood pressure, pulse, and                            oxygen saturations were monitored continuously. The                            Olympus PCF-H190DL ([id number]) Colonoscope was                            introduced through the anus and advanced to the the                            cecum, identified  by appendiceal orifice and                            ileocecal valve. The colonoscopy was performed                            without difficulty. The patient tolerated the                            procedure well. The quality of the bowel                            preparation was good except in the cecum where                            there was solid stool which could not be fully                            washed. The ileocecal valve, appendiceal orifice,                            and rectum were photographed. Scope In: 8:13:25 AM Scope Out: 8:29:53 AM Scope Withdrawal Time: 0 hours 12 minutes 2 seconds  Total Procedure Duration: 0 hours 16 minutes 28 seconds  Findings:                 A 6 mm polyp was found in the proximal ascending                            colon. The polyp was sessile. The polyp was removed                            with a cold snare. Resection and retrieval were                            complete.                           Non-bleeding internal hemorrhoids were found during  retroflexion. The hemorrhoids were small.                           The exam was otherwise without abnormality on                            direct and retroflexion views. Complications:            No immediate complications. Estimated Blood Loss:     Estimated blood loss: none. Impression:               - One 6 mm polyp in the proximal ascending colon,                            removed with a cold snare. Resected and retrieved.                           - Non-bleeding internal hemorrhoids.                           - The examination was otherwise normal on direct                            and retroflexion views. Recommendation:           - Patient has a contact number available for                            emergencies. The signs and symptoms of potential                            delayed complications were discussed with the                             patient. Return to normal activities tomorrow.                            Written discharge instructions were provided to the                            patient.                           - Resume previous diet.                           - Continue present medications.                           - Await pathology results.                           - Repeat colonoscopy for surveillance based on                            pathology results.                           -  The findings and recommendations were discussed                            with the patient's family. Jackquline Denmark, MD 03/04/2021 8:35:51 AM This report has been signed electronically.

## 2021-03-04 NOTE — Patient Instructions (Signed)
YOU HAD AN ENDOSCOPIC PROCEDURE TODAY AT THE [facility]:   Refer to the procedure report that was given to you for any specific questions about what was found during the examination.  If the procedure report does not answer your questions, please call your gastroenterologist to clarify.  If you requested that your care partner not be given the details of your procedure findings, then the procedure report has been included in a sealed envelope for you to review at your convenience later.  YOU SHOULD EXPECT: Some feelings of bloating in the abdomen. Passage of more gas than usual.  Walking can help get rid of the air that was put into your GI tract during the procedure and reduce the bloating. If you had a lower endoscopy (such as a colonoscopy or flexible sigmoidoscopy) you may notice spotting of blood in your stool or on the toilet paper. If you underwent a bowel prep for your procedure, you may not have a normal bowel movement for a few days.  Please Note:  You might notice some irritation and congestion in your nose or some drainage.  This is from the oxygen used during your procedure.  There is no need for concern and it should clear up in a day or so.  SYMPTOMS TO REPORT IMMEDIATELY:   Following lower endoscopy (colonoscopy or flexible sigmoidoscopy):  Excessive amounts of blood in the stool  Significant tenderness or worsening of abdominal pains  Swelling of the abdomen that is new, acute  Fever of 100F or higher   Following upper endoscopy (EGD)  Vomiting of blood or coffee ground material  New chest pain or pain under the shoulder blades  Painful or persistently difficult swallowing  New shortness of breath  Fever of 100F or higher  Black, tarry-looking stools  For urgent or emergent issues, a gastroenterologist can be reached at any hour by calling [phone]. Do not use MyChart messaging for urgent concerns.    DIET:  We do recommend a small meal at first, but  then you may proceed to your regular diet.  Drink plenty of fluids but you should avoid alcoholic beverages for 24 hours.  ACTIVITY:  You should plan to take it easy for the rest of today and you should NOT DRIVE or use heavy machinery until tomorrow (because of the sedation medicines used during the test).    FOLLOW UP: Our staff will call the number listed on your records 48-72 hours following your procedure to check on you and address any questions or concerns that you may have regarding the information given to you following your procedure. If we do not reach you, we will leave a message.  We will attempt to reach you two times.  During this call, we will ask if you have developed any symptoms of COVID 19. If you develop any symptoms (ie: fever, flu-like symptoms, shortness of breath, cough etc.) before then, please call [phone].  If you test positive for Covid 19 in the 2 weeks post procedure, please call and report this information to us.    If any biopsies were taken you will be contacted by phone or by letter within the next 1-3 weeks.  Please call us at [phone] if you have not heard about the biopsies in 3 weeks.    SIGNATURES/CONFIDENTIALITY: You and/or your care partner have signed paperwork which will be entered into your electronic medical record.  These signatures attest to the fact that that the information above on   your After Visit Summary has been reviewed and is understood.  Full responsibility of the confidentiality of this discharge information lies with you and/or your care-partner. 

## 2021-03-04 NOTE — Progress Notes (Signed)
Pt's states no medical or surgical changes since previsit or office visit.  Check-in-JB  Vital Signs-SH

## 2021-03-04 NOTE — Progress Notes (Signed)
Called to room to assist during endoscopic procedure.  Patient ID and intended procedure confirmed with present staff. Received instructions for my participation in the procedure from the performing physician.  

## 2021-03-04 NOTE — Progress Notes (Signed)
pt tolerated well. VSS. awake and to recovery. Report given to RN.  

## 2021-03-08 ENCOUNTER — Telehealth: Payer: Self-pay

## 2021-03-08 NOTE — Telephone Encounter (Signed)
  Follow up Call-  Call back number 03/04/2021  Post procedure Call Back phone  # [phone]  Permission to leave phone message Yes  Some recent data might be hidden     Left message on answering machine.

## 2021-03-23 ENCOUNTER — Encounter: Payer: Self-pay | Admitting: Gastroenterology

## 2021-03-31 ENCOUNTER — Emergency Department (HOSPITAL_BASED_OUTPATIENT_CLINIC_OR_DEPARTMENT_OTHER): Payer: BC Managed Care – PPO | Admitting: Radiology

## 2021-03-31 ENCOUNTER — Emergency Department (HOSPITAL_BASED_OUTPATIENT_CLINIC_OR_DEPARTMENT_OTHER): Payer: BC Managed Care – PPO

## 2021-03-31 ENCOUNTER — Other Ambulatory Visit: Payer: Self-pay

## 2021-03-31 ENCOUNTER — Emergency Department (HOSPITAL_BASED_OUTPATIENT_CLINIC_OR_DEPARTMENT_OTHER)
Admission: EM | Admit: 2021-03-31 | Discharge: 2021-03-31 | Disposition: A | Discharge status: Home / Self Care | Payer: BC Managed Care – PPO | Attending: Emergency Medicine | Admitting: Emergency Medicine

## 2021-03-31 ENCOUNTER — Encounter (HOSPITAL_BASED_OUTPATIENT_CLINIC_OR_DEPARTMENT_OTHER): Payer: Self-pay | Admitting: Obstetrics and Gynecology

## 2021-03-31 DIAGNOSIS — I10 Essential (primary) hypertension: Secondary | ICD-10-CM | POA: Insufficient documentation

## 2021-03-31 DIAGNOSIS — Z79899 Other long term (current) drug therapy: Secondary | ICD-10-CM | POA: Diagnosis not present

## 2021-03-31 DIAGNOSIS — R590 Localized enlarged lymph nodes: Secondary | ICD-10-CM | POA: Insufficient documentation

## 2021-03-31 DIAGNOSIS — Z87891 Personal history of nicotine dependence: Secondary | ICD-10-CM | POA: Diagnosis not present

## 2021-03-31 DIAGNOSIS — R202 Paresthesia of skin: Secondary | ICD-10-CM | POA: Insufficient documentation

## 2021-03-31 LAB — BASIC METABOLIC PANEL
Anion gap: 9 (ref 5–15)
BUN: 17 mg/dL (ref 6–20)
CO2: 27 mmol/L (ref 22–32)
Calcium: 9.5 mg/dL (ref 8.9–10.3)
Chloride: 103 mmol/L (ref 98–111)
Creatinine, Ser: 0.85 mg/dL (ref 0.44–1.00)
GFR, Estimated: >60 mL/min (ref >60)
Glucose, Bld: 92 mg/dL (ref 70–99)
Potassium: 3.8 mmol/L (ref 3.5–5.1)
Sodium: 139 mmol/L (ref 135–145)

## 2021-03-31 LAB — CBC
HCT: 39.1 % (ref 36.0–46.0)
Hemoglobin: 13.1 g/dL (ref 12.0–15.0)
MCH: 31 pg (ref 26.0–34.0)
MCHC: 33.5 g/dL (ref 30.0–36.0)
MCV: 92.7 fL (ref 80.0–100.0)
Platelets: 285 10*3/uL (ref 150–400)
RBC: 4.22 MIL/uL (ref 3.87–5.11)
RDW: 12 % (ref 11.5–15.5)
WBC: 6.7 10*3/uL (ref 4.0–10.5)
nRBC: 0 % (ref 0.0–0.2)

## 2021-03-31 LAB — TROPONIN I (HIGH SENSITIVITY): Troponin I (High Sensitivity): 11 ng/L (ref <18)

## 2021-03-31 MED ORDER — KETOROLAC TROMETHAMINE 30 MG/ML IJ SOLN
30.0000 mg | Freq: Once | INTRAMUSCULAR | Status: AC
  Administered 2021-03-31: 30 mg via INTRAVENOUS
  Filled 2021-03-31: qty 1

## 2021-03-31 NOTE — ED Provider Notes (Signed)
[facility] EMERGENCY DEPT Provider Note   CSN: [id number] Arrival date & time: 03/31/21  1432     History Chief Complaint  Patient presents with  . Migraine    Heather Copeland is a 50 y.o. female.  HPI   Patient presents to the ED for evaluation of heaviness on the left side of her neck and arm.  Patient has a history of migraines.  She started having a migraine in the last couple of days.  On Monday it was more severe.  She has been taking her medications.  Its not resolved but is not particularly severe.  She continues to have heaviness and fatigue on the left side of her arm.  Does not feel normal for her.  He is not having any trouble with chest pain.  No shortness of breath.  No fevers or chills.  Past Medical History:  Diagnosis Date  . Allergy   . Anxiety   . Depression   . Hypertension   . Migraines   . Migraines     Patient Active Problem List   Diagnosis Date Noted  . Family history of breast cancer 11/07/2012  . Nipple retraction 11/07/2012  . Migraines   . Anxiety   . Depression     Past Surgical History:  Procedure Laterality Date  . C yst removal  10 yrs ago   left middle finger  . Disc fusion  3 yrs ago   c5/c6  . INCISION AND DRAINAGE ABSCESS     tonsil  . TONSILLECTOMY  33yrs ago     OB History    Gravida  3   Para  2   Term  0   Preterm  0   AB  1   Living  2     SAB  1   IAB  0   Ectopic  0   Multiple  0   Live Births              Family History  Problem Relation Age of Onset  . Stroke Father 80  . Heart attack Father   . Hypertension Mother   . Depression Mother   . Anxiety disorder Mother   . Depression Sister   . Anxiety disorder Sister   . Stroke Maternal Grandfather   . Heart disease Maternal Grandmother   . Breast cancer Maternal Grandmother   . Colon cancer Neg Hx   . Esophageal cancer Neg Hx   . Rectal cancer Neg Hx   . Stomach cancer Neg Hx     Social History   Tobacco Use  .  Smoking status: Former Smoker    Quit date: 1998    Years since quitting: 24.4  . Smokeless tobacco: Never Used  Vaping Use  . Vaping Use: Never used  Substance Use Topics  . Alcohol use: Not Currently  . Drug use: Never    Home Medications Prior to Admission medications   Medication Sig Start Date End Date Taking? Authorizing Provider  buPROPion (WELLBUTRIN XL) 150 MG 24 hr tablet Take 150 mg by mouth daily.    [provider]  Calcium Carb-Cholecalciferol (CALCIUM 500/D) 500-200 MG-UNIT TABS Take by mouth.    [provider]  fluticasone (FLONASE) 50 MCG/ACT nasal spray Place 2 sprays into the nose daily.    [provider]  Magnesium 500 MG CAPS Take by mouth.    [provider]  Omega 3 1200 MG CAPS Take by mouth.  [provider]  Probiotic Product (PROBIOTIC ADVANCED PO) Take by mouth.    [provider]  ramipril (ALTACE) 2.5 MG tablet Take 2.5 mg by mouth daily.    [provider]  SUMAtriptan (IMITREX) 100 MG tablet Take 100 mg by mouth every 2 (two) hours as needed.    [provider]  valACYclovir (VALTREX) 1000 MG tablet valacyclovir 1 gram tablet Patient not taking: No sig reported    [provider]    Allergies    Sulfa antibiotics  Review of Systems   Review of Systems  All other systems reviewed and are negative.   Physical Exam Updated Vital Signs BP 135/86 (BP Location: Left Arm)   Pulse 81   Temp 98.5 F (36.9 C) (Oral)   Resp 16   LMP 12/27/2015 (Approximate)   SpO2 100%   Physical Exam Vitals and nursing note reviewed.  Constitutional:      General: She is not in acute distress.    Appearance: She is well-developed.  HENT:     Head: Normocephalic and atraumatic.     Right Ear: External ear normal.     Left Ear: External ear normal.  Eyes:     General: No scleral icterus.       Right eye: No discharge.        Left eye: No discharge.     Conjunctiva/sclera:  Conjunctivae normal.  Neck:     Trachea: No tracheal deviation.  Cardiovascular:     Rate and Rhythm: Normal rate and regular rhythm.  Pulmonary:     Effort: Pulmonary effort is normal. No respiratory distress.     Breath sounds: Normal breath sounds. No stridor. No wheezing or rales.  Abdominal:     General: Bowel sounds are normal. There is no distension.     Palpations: Abdomen is soft.     Tenderness: There is no abdominal tenderness. There is no guarding or rebound.  Musculoskeletal:        General: No tenderness.     Cervical back: Neck supple.  Skin:    General: Skin is warm and dry.     Findings: No rash.  Neurological:     Mental Status: She is alert and oriented to person, place, and time.     Cranial Nerves: No cranial nerve deficit (No facial droop, extraocular movements intact, tongue midline ).     Sensory: No sensory deficit.     Motor: No abnormal muscle tone or seizure activity.     Coordination: Coordination normal.     Comments: No pronator drift bilateral upper extrem, able to hold both legs off bed for 5 seconds, sensation intact in all extremities, no visual field cuts, no left or right sided neglect, normal finger-nose exam bilaterally, no nystagmus noted      ED Results / Procedures / Treatments   Labs (all labs ordered are listed, but only abnormal results are displayed) Labs Reviewed  BASIC METABOLIC PANEL  CBC  TROPONIN I (HIGH SENSITIVITY)    EKG EKG Interpretation  Date/Time:  Thursday March 31 2021 15:12:51 EDT Ventricular Rate:  82 PR Interval:  168 QRS Duration: 96 QT Interval:  372 QTC Calculation: 434 R Axis:   86 Text Interpretation: Normal sinus rhythm with sinus arrhythmia Normal ECG nonspecific t wave changes lateral leads Confirmed by Dorie Rank [phone]) on 03/31/2021 3:16:40 PM   Radiology DG Chest 2 View  Result Date: 03/31/2021 CLINICAL DATA:  Chest pain. EXAM: CHEST - 2  VIEW COMPARISON:  None. FINDINGS: Cardiac silhouette  is within normal limits. Prominent, lobular hilar silhouette bilaterally. Both lungs are clear. No visible pleural effusions or pneumothorax. No acute osseous abnormality. Postsurgical changes in the lower cervical spine, partially imaged. IMPRESSION: Prominent, lobular hilar silhouette bilateral, concerning for hilar adenopathy. Recommend dedicated chest CT with contrast to further evaluate. Electronically Signed   By: Margaretha Sheffield MD   On: 03/31/2021 15:15   CT Head Wo Contrast  Result Date: 03/31/2021 CLINICAL DATA:  Neuro deficit, acute, stroke suspected Patient reports fatigue and heaviness of left side of neck and arm. Intermittent headache. EXAM: CT HEAD WITHOUT CONTRAST TECHNIQUE: Contiguous axial images were obtained from the base of the skull through the vertex without intravenous contrast. COMPARISON:  None. FINDINGS: Brain: No intracranial hemorrhage, mass effect, or midline shift. No hydrocephalus. The basilar cisterns are patent. No evidence of territorial infarct or acute ischemia. No extra-axial or intracranial fluid collection. Vascular: No hyperdense vessel or unexpected calcification. Skull: No fracture or focal lesion. Sinuses/Orbits: Paranasal sinuses and mastoid air cells are clear. The visualized orbits are unremarkable. Other: None. IMPRESSION: Negative noncontrast head CT. Electronically Signed   By: Keith Rake M.D.   On: 03/31/2021 15:10    Procedures Procedures   Medications Ordered in ED Medications  ketorolac (TORADOL) 30 MG/ML injection 30 mg (30 mg Intravenous Given 03/31/21 1620)    ED Course  I have reviewed the triage vital signs and the nursing notes.  Pertinent labs & imaging results that were available during my care of the patient were reviewed by me and considered in my medical decision making (see chart for details).  Clinical Course as of 03/31/21 1705  Thu Mar 31, 2021  1550 CT without acute findings [JK]  1550 Chest x-ray suggest hilar  lymphadenopathy [JK]  1606 Chest CT with contrast recommended.  Unfortunately due to the national contrast shortage unable to proceed with that CT scan here today.  No emergent need for the CT scan at this time [JK]    Clinical Course User Index [JK] Dorie Rank, MD   MDM Rules/Calculators/A&P                          Patient presented to the ED for evaluation of a sensation of heaviness in her left arm.  Is not having any difficulty with her grip strength.  No trouble with any speech or vision issues.  She is not having any issues with her gait.  Patient does have a history of migraines and has had a migraine recently.  Differential includes complex migraine, cervical radiculopathy, occult stroke.  Patient's ED work-up here today is reassuring with normal head CT.  Laboratory tests are unremarkable.  Certainly cannot exclude the possibility of occult stroke.  Unfortunate at this freestanding ED MRI is not available.  Discussed this with the patient and the options of transferring to another ED to proceed with MRI testing.  At this time the patient is reassured with the initial ED evaluation.  She would prefer to follow-up with her neurologist and possibly obtain an MRI as an outpatient.  She does not want to go to the ED tonight to have an MRI.  Chest x-ray also showed incidental finding suggestive of hilar lymphadenopathy.  I discussed this finding with the patient.  CT chest with contrast is recommended.  We are unable to proceed with contrasted CT study at this time due to the national contrast shortage.  No emergent need for CT scanning.  Discussed this finding with the patient she will follow-up with her primary care doctor Final Clinical Impression(s) / ED Diagnoses Final diagnoses:  Hilar lymphadenopathy  Paresthesia    Rx / DC Orders ED Discharge Orders    None       Dorie Rank, MD 03/31/21 1705

## 2021-03-31 NOTE — Discharge Instructions (Addendum)
Follow-up with your neurologist as we discussed for further evaluation of your arm numbness.  Return to [facility], ED if you have any worsening symptoms.  The chest x-ray showed an incidental findings suggesting possible hilar lymphadenopathy.  These are enlarged lymph nodes within an area in your chest.  A CT scan with contrast is recommended.  Contact your primary care doctor to have that arranged.

## 2021-03-31 NOTE — ED Triage Notes (Signed)
Patient reports on Monday she had a bad migraine and took [place] and imitrex. Patient reports the next day she felt exhausted. Patient reports her whole left side feels heavy. Patient reports a feeling of tingling going down the left arm and side but not in her legs. Patient reports she has had severe fatigue as well.

## 2021-03-31 NOTE — ED Provider Notes (Signed)
Emergency Medicine Provider Triage Evaluation Note  Heather Copeland , a 50 y.o. female  was evaluated in triage.  Pt complains of 2 days of fatigue and heaviness of the left side of her neck and arm.  She has had intermittent headaches but has a history of chronic headaches..  Review of Systems  Positive: HA Negative: Vision changes  Physical Exam  BP 135/86 (BP Location: Left Arm)   Pulse 81   Temp 98.5 F (36.9 C) (Oral)   Resp 16   LMP 12/27/2015 (Approximate)   SpO2 100%  Gen:   Awake, no distress   Resp:  Normal effort  MSK:   Moves extremities without difficulty  Other:  Cranial nerves II through XII are normal.  Normal strength and sensation throughout the upper and lower extremities.  Gait is normal.  Finger-nose testing is normal.  Medical Decision Making  Medically screening exam initiated at 2:49 PM.  Appropriate orders placed.  Lia Foyer was informed that the remainder of the evaluation will be completed by another provider, this initial triage assessment does not replace that evaluation, and the importance of remaining in the ED until their evaluation is complete.     Arnaldo Natal, MD 03/31/21 1450

## 2021-04-04 ENCOUNTER — Other Ambulatory Visit: Payer: Self-pay | Admitting: Internal Medicine

## 2021-04-04 DIAGNOSIS — G43909 Migraine, unspecified, not intractable, without status migrainosus: Secondary | ICD-10-CM

## 2021-04-07 ENCOUNTER — Ambulatory Visit
Admission: RE | Admit: 2021-04-07 | Discharge: 2021-04-07 | Disposition: A | Discharge status: Home / Self Care | Payer: BC Managed Care – PPO | Source: Ambulatory Visit | Attending: Internal Medicine | Admitting: Internal Medicine

## 2021-04-07 ENCOUNTER — Other Ambulatory Visit: Payer: Self-pay

## 2021-04-07 DIAGNOSIS — G43909 Migraine, unspecified, not intractable, without status migrainosus: Secondary | ICD-10-CM

## 2021-05-17 ENCOUNTER — Ambulatory Visit: Payer: BC Managed Care – PPO | Admitting: Neurology

## 2021-05-17 ENCOUNTER — Other Ambulatory Visit: Payer: Self-pay

## 2021-05-17 ENCOUNTER — Encounter: Payer: Self-pay | Admitting: Neurology

## 2021-05-17 ENCOUNTER — Telehealth: Payer: Self-pay | Admitting: Neurology

## 2021-05-17 VITALS — BP 119/83 | HR 74 | Ht 62.0 in | Wt 130.0 lb

## 2021-05-17 DIAGNOSIS — G43709 Chronic migraine without aura, not intractable, without status migrainosus: Secondary | ICD-10-CM | POA: Diagnosis not present

## 2021-05-17 NOTE — Patient Instructions (Signed)
OnabotulinumtoxinA injection (Medical Use) What is this medication? ONABOTULINUMTOXINA (o na BOTT you lye num tox in eh) is a neuro-muscular blocker. This medicine is used to treat crossed eyes, eyelid spasms, severe neck muscle spasms, ankle and toe muscle spasms, and elbow, wrist, and finger muscle spasms. It is also used to treat excessive underarm sweating, to prevent chronic migraine headaches, and to treat loss of bladder control due toneurologic conditions such as multiple sclerosis or spinal cord injury. This medicine may be used for other purposes; ask your health care provider orpharmacist if you have questions. COMMON BRAND NAME(S): Botox What should I tell my care team before I take this medication? They need to know if you have any of these conditions: breathing problems cerebral palsy spasms difficulty urinating heart problems history of surgery where this medicine is going to be used infection at the site where this medicine is going to be used myasthenia gravis or other neurologic disease nerve or muscle disease surgery plans take medicines that treat or prevent blood clots thyroid problems an unusual or allergic reaction to botulinum toxin, albumin, other medicines, foods, dyes, or preservatives pregnant or trying to get pregnant breast-feeding How should I use this medication? This medicine is for injection into a muscle. It is given by a health careprofessional in a hospital or clinic setting. Talk to your pediatrician regarding the use of this medicine in children. While this drug may be prescribed for children as young as 67 years old for selectedconditions, precautions do apply. Overdosage: If you think you have taken too much of this medicine contact apoison control center or emergency room at once. NOTE: This medicine is only for you. Do not share this medicine with others. What if I miss a dose? This does not apply. What may interact with this  medication? aminoglycoside antibiotics like gentamicin, neomycin, tobramycin muscle relaxants other botulinum toxin injections This list may not describe all possible interactions. Give your health care provider a list of all the medicines, herbs, non-prescription drugs, or dietary supplements you use. Also tell them if you smoke, drink alcohol, or use illegaldrugs. Some items may interact with your medicine. What should I watch for while using this medication? Visit your doctor for regular check ups. This medicine will cause weakness in the muscle where it is injected. Tell your doctor if you feel unusually weak in other muscles. Get medical help right awayif you have problems with breathing, swallowing, or talking. This medicine might make your eyelids droop or make you see blurry or double. If you have weak muscles or trouble seeing do not drive a car, use machinery,or do other dangerous activities. This medicine contains albumin from human blood. It may be possible to pass an infection in this medicine, but no cases have been reported. Talk to yourdoctor about the risks and benefits of this medicine. If your activities have been limited by your condition, go back to your regularroutine slowly after treatment with this medicine. What side effects may I notice from receiving this medication? Side effects that you should report to your doctor or health care professionalas soon as possible: allergic reactions like skin rash, itching or hives, swelling of the face, lips, or tongue breathing problems changes in vision chest pain or tightness eye irritation, pain fast, irregular heartbeat infection numbness speech problems swallowing problems unusual weakness Side effects that usually do not require medical attention (report to yourdoctor or health care professional if they continue or are bothersome): bruising or pain at site where  injected drooping eyelid dry eyes or  mouth headache muscles aches, pains sensitivity to light tearing This list may not describe all possible side effects. Call your doctor for medical advice about side effects. You may report side effects to FDA at1-800-FDA-1088. Where should I keep my medication? This drug is given in a hospital or clinic and will not be stored at home. NOTE: This sheet is a summary. It may not cover all possible information. If you have questions about this medicine, talk to your doctor, pharmacist, orhealth care provider.  2022 [employer]vier/Gold Standard (2018-04-22 14:21:42)

## 2021-05-17 NOTE — Telephone Encounter (Signed)
Please initiate Botox for chronic migraines protocol with patient.  She can be injected by myself.

## 2021-05-17 NOTE — Progress Notes (Signed)
[name]    Provider:  Dr Jaynee Eagles Requesting Provider: Prince Solian, MD Primary Care Provider:  Prince Solian, MD  CC:  Migraines  HPI:  Heather Copeland is a 50 y.o. female here as requested by Prince Solian, MD for migraines.Her migraines are more attributed to hormones, also has pain in her cervical spine, she is seeing Dr. Vertell Limber for her neck, she carries a lot of tension in her necks, she is getting dry needling, she has been going to PT. She is going back in August. She was on Emgality but the burn was so bad she couldn't do it, she didn't notice it helped her as much. She has over 16 moderate to severe migraine days a month.  The migraines can be unilateral, pulsating pounding throbbing, nausea and vomiting, movement makes it worse, they can last 24 hours or longer, no medication overuse, no aura, Imitrex helps acutely.  She also has Daily headaches. She is very sensitive with medications. Roselyn Meier makes her tired, imitrex works, could try nurtec. She sleeps well, no significant sleeping issues or snoring.  This has been ongoing for years at this severity and frequency.  She had an MRI because of some left-sided "heaviness" which resolved., MRI was unremarkable.   Reviewed notes, labs and imaging from outside physicians, which showed: personally reviewed MRI images and agree with report of MRI, also reviewed images with patient.  MRI 04/07/2021: 1. No evidence of acute intracranial abnormality. Specifically, no acute infarct. 2. Mild scattered T2/FLAIR hyperintensities within the white matter, which are mildly advanced in number for patient age. This finding is nonspecific but can be seen in the setting of chronic microvascular ischemia, a demyelinating process such as multiple sclerosis, chronic migraines, or as the sequelae of a prior infectious or inflammatory process.  03/31/2021: cbc, bmp normal  Reviewed notes from Dr. Sima Matas, specifically medications  patient has tried in the past, agree with list see below: Current and past medications: ANALGESICS: BC/Goody's, Tylenol/Acetaminophen and Vicodin/Vicoprofen.  BP: ramipril, atenolol  Anti-Migraine: Midrin/Duradrin, Imitrex, Maxalt and Relpax-HAs worse.  Decongestant/Antihistamine: sudafed/Pseudoephedrine.  Sleeping Pills/Tranquilizers: Ambien/Cr/Zolipem and Halcion/Triazolam. Muscle Relaxants: can not tolerate Anti-Depressants: Effexor/Venlafaxine, Tofranil/Imipramine (constipation) and Zoloft/Sertraline. Nortriptyline, Prozac, wellbutrin Antiepileptics: Topamax (foggy), Zonegran (worse HAs) Herbal: Feverfew.  Hormonal: Orthoevra Patch. Other: Emgality Procedures for headaches:   Review of Systems: Patient complains of symptoms per HPI as well as the following symptoms headache. Pertinent negatives and positives per HPI. All others negative.   Social History   Socioeconomic History   Marital status: Married    Spouse name: Not on file   Number of children: Not on file   Years of education: Not on file   Highest education level: Not on file  Occupational History   Not on file  Tobacco Use   Smoking status: Former    Types: Cigarettes    Quit date: 59    Years since quitting: 24.5   Smokeless tobacco: Never  Vaping Use   Vaping Use: Never used  Substance and Sexual Activity   Alcohol use: Not Currently   Drug use: Never   Sexual activity: Yes  Other Topics Concern   Not on file  Social History Narrative   Not on file   Social Determinants of Health   Financial Resource Strain: Not on file  Food Insecurity: Not on file  Transportation Needs: Not on file  Physical Activity: Not on file  Stress: Not on file  Social Connections: Not on file  Intimate Partner Violence:  Not on file    Family History  Problem Relation Age of Onset   Stroke Father 40   Heart attack Father    Hypertension Mother    Depression Mother    Anxiety disorder Mother    Depression  Sister    Anxiety disorder Sister    Stroke Maternal Grandfather    Heart disease Maternal Grandmother    Breast cancer Maternal Grandmother    Colon cancer Neg Hx    Esophageal cancer Neg Hx    Rectal cancer Neg Hx    Stomach cancer Neg Hx     Past Medical History:  Diagnosis Date   Allergy    Anxiety    Depression    Hypertension    Migraines    Migraines     Patient Active Problem List   Diagnosis Date Noted   Chronic migraine w/o aura w/o status migrainosus, not intractable 05/17/2021   Family history of breast cancer 11/07/2012   Nipple retraction 11/07/2012   Migraines    Anxiety    Depression     Past Surgical History:  Procedure Laterality Date   C yst removal  10 yrs ago   left middle finger   Disc fusion  3 yrs ago   c5/c6   INCISION AND DRAINAGE ABSCESS     tonsil   TONSILLECTOMY  38yrs ago    Current Outpatient Medications  Medication Sig Dispense Refill   buPROPion (WELLBUTRIN XL) 150 MG 24 hr tablet Take 150 mg by mouth daily.     Calcium Carb-Cholecalciferol (CALCIUM 500/D) 500-200 MG-UNIT TABS Take by mouth.     fluticasone (FLONASE) 50 MCG/ACT nasal spray Place 2 sprays into the nose daily.     Magnesium 500 MG CAPS Take by mouth.     Omega 3 1200 MG CAPS Take by mouth.     Probiotic Product (PROBIOTIC ADVANCED PO) Take by mouth.     ramipril (ALTACE) 2.5 MG tablet Take 2.5 mg by mouth daily.     SUMAtriptan (IMITREX) 100 MG tablet Take 100 mg by mouth every 2 (two) hours as needed.     No current facility-administered medications for this visit.    Allergies as of 05/17/2021 - Review Complete 05/17/2021  Allergen Reaction Noted   Sulfa antibiotics  11/07/2012    Vitals: BP 119/83   Pulse 74   Ht 5\' 2"  (1.575 m)   Wt 130 lb (59 kg)   LMP 12/27/2015 (Approximate)   BMI 23.78 kg/m  Last Weight:  Wt Readings from Last 1 Encounters:  05/17/21 130 lb (59 kg)   Last Height:   Ht Readings from Last 1 Encounters:  05/17/21 5\' 2"   (1.575 m)     Physical exam: Exam: Gen: NAD, conversant, well nourised, well groomed                     CV: RRR, no MRG. No Carotid Bruits. No peripheral edema, warm, nontender Eyes: Conjunctivae clear without exudates or hemorrhage  Neuro: Detailed Neurologic Exam  Speech:    Speech is normal; fluent and spontaneous with normal comprehension.  Cognition:    The patient is oriented to person, place, and time;     recent and remote memory intact;     language fluent;     normal attention, concentration,     fund of knowledge Cranial Nerves:    The pupils are equal, round, and reactive to light. The fundi are normal and spontaneous venous  pulsations are present. Visual fields are full to finger confrontation. Extraocular movements are intact. Trigeminal sensation is intact and the muscles of mastication are normal. The face is symmetric. The palate elevates in the midline. Hearing intact. Voice is normal. Shoulder shrug is normal. The tongue has normal motion without fasciculations.   Coordination:    Normal  Gait:    normal.   Motor Observation:    No asymmetry, no atrophy, and no involuntary movements noted. Tone:    Normal muscle tone.    Posture:    Posture is normal. normal erect    Strength:    Strength is V/V in the upper and lower limbs.      Sensation: intact to LT     Reflex Exam:  DTR's:    Deep tendon reflexes in the upper and lower extremities are normal bilaterally.   Toes:    The toes are downgoing bilaterally.   Clonus:    Clonus is absent.    Assessment/Plan: This is a patient with chronic migraines.  She is tried multiple medications, ongoing chronic migraines for years, has been to other headache centers, she is tried and failed multiple medications including the CGRP injections which she cannot take due to side effects, we discussed trying a toe [place] at, [employer], but in this patient I did recommend Botox and I think she would be an excellent  candidate  -Initiate Botox protocol for migraines please prevention -MRI showed nonspecific white matter changes likely related to chronic microvascular ischemia or more likely migraines.  I reviewed this with patient and reassured her. -Continue Imitrex  Orders Placed This Encounter  Procedures   TSH    No orders of the defined types were placed in this encounter.   Cc: Prince Solian, MD,  Prince Solian, MD  Sarina Ill, MD  [facility] [address] [place], [place] [address]  Phone [phone] Fax [phone]

## 2021-05-25 NOTE — Telephone Encounter (Signed)
Charge sheet completed, pending MD signature. Need pt consent at first injection appointment.

## 2021-05-25 NOTE — Telephone Encounter (Signed)
Received charge sheet for 200 units of Botox for G43.709. Will fill out [employer] PA form and give to MD to sign.

## 2021-05-30 ENCOUNTER — Telehealth: Payer: Self-pay | Admitting: *Deleted

## 2021-05-30 NOTE — Telephone Encounter (Signed)
R/c note from [facility] notes on [place] desk.

## 2021-06-07 NOTE — Telephone Encounter (Signed)
Auth pending for Botox.

## 2021-06-09 MED ORDER — BOTOX 200 UNITS IJ SOLR: INTRAMUSCULAR | 1 refills | Status: DC

## 2021-06-09 NOTE — Addendum Note (Signed)
Addended by: Gildardo Griffes on: 06/09/2021 12:08 PM   Modules accepted: Orders

## 2021-06-09 NOTE — Telephone Encounter (Signed)
Received approval for Botox. PA reference #[id number] (06/07/21- 12/04/21). We will use [facility].

## 2021-06-22 NOTE — Telephone Encounter (Signed)
I called [facility] to check status of Botox. I spoke with Shovanna who states the prescription is still being verified.

## 2021-07-06 NOTE — Telephone Encounter (Signed)
Spoke with [facility], Botox TBD 9/13.

## 2021-07-12 ENCOUNTER — Ambulatory Visit: Payer: BC Managed Care – PPO | Admitting: Neurology

## 2021-07-12 ENCOUNTER — Other Ambulatory Visit: Payer: Self-pay

## 2021-07-12 DIAGNOSIS — G43709 Chronic migraine without aura, not intractable, without status migrainosus: Secondary | ICD-10-CM | POA: Diagnosis not present

## 2021-07-12 NOTE — Telephone Encounter (Signed)
Received (1) 200 unit vial of Botox today from [facility]. 

## 2021-07-12 NOTE — Progress Notes (Signed)
Consent Form Botulism Toxin Injection For Chronic Migraine  First botox 07/12/2021: She has over 16 moderate to severe migraine days a month.  The migraines can be unilateral, pulsating pounding throbbing, nausea and vomiting, movement makes it worse, they can last 24 hours or longer, no medication overuse, no aura, Imitrex helps acutely.  She also has Daily headaches and lots of neck pain.+a. May out the botox a little higher in frontalis if she felt she had heavy brows.  Reviewed orally with patient, additionally signature is on file:  Botulism toxin has been approved by the Federal drug administration for treatment of chronic migraine. Botulism toxin does not cure chronic migraine and it may not be effective in some patients.  The administration of botulism toxin is accomplished by injecting a small amount of toxin into the muscles of the neck and head. Dosage must be titrated for each individual. Any benefits resulting from botulism toxin tend to wear off after 3 months with a repeat injection required if benefit is to be maintained. Injections are usually done every 3-4 months with maximum effect peak achieved by about 2 or 3 weeks. Botulism toxin is expensive and you should be sure of what costs you will incur resulting from the injection.  The side effects of botulism toxin use for chronic migraine may include:   -Transient, and usually mild, facial weakness with facial injections  -Transient, and usually mild, head or neck weakness with head/neck injections  -Reduction or loss of forehead facial animation due to forehead muscle weakness  -Eyelid drooping  -Dry eye  -Pain at the site of injection or bruising at the site of injection  -Double vision  -Potential unknown long term risks  Contraindications: You should not have Botox if you are pregnant, nursing, allergic to albumin, have an infection, skin condition, or muscle weakness at the site of the injection, or have myasthenia gravis,  Lambert-Eaton syndrome, or ALS.  It is also possible that as with any injection, there may be an allergic reaction or no effect from the medication. Reduced effectiveness after repeated injections is sometimes seen and rarely infection at the injection site may occur. All care will be taken to prevent these side effects. If therapy is given over a long time, atrophy and wasting in the muscle injected may occur. Occasionally the patient's become refractory to treatment because they develop antibodies to the toxin. In this event, therapy needs to be modified.  I have read the above information and consent to the administration of botulism toxin.    BOTOX PROCEDURE NOTE FOR MIGRAINE HEADACHE    Contraindications and precautions discussed with patient(above). Aseptic procedure was observed and patient tolerated procedure. Procedure performed by Dr. Georgia Dom  The condition has existed for more than 6 months, and pt does not have a diagnosis of ALS, Myasthenia Gravis or Lambert-Eaton Syndrome.  Risks and benefits of injections discussed and pt agrees to proceed with the procedure.  Written consent obtained  These injections are medically necessary. Pt  receives good benefits from these injections. These injections do not cause sedations or hallucinations which the oral therapies may cause.  Description of procedure:  The patient was placed in a sitting position. The standard protocol was used for Botox as follows, with 5 units of Botox injected at each site:   -Procerus muscle, midline injection  -Corrugator muscle, bilateral injection  -Frontalis muscle, bilateral injection, with 2 sites each side, medial injection was performed in the upper one third of the frontalis  muscle, in the region vertical from the medial inferior edge of the superior orbital rim. The lateral injection was again in the upper one third of the forehead vertically above the lateral limbus of the cornea, 1.5 cm lateral  to the medial injection site.  -Temporalis muscle injection, 4 sites, bilaterally. The first injection was 3 cm above the tragus of the ear, second injection site was 1.5 cm to 3 cm up from the first injection site in line with the tragus of the ear. The third injection site was 1.5-3 cm forward between the first 2 injection sites. The fourth injection site was 1.5 cm posterior to the second injection site.   -Occipitalis muscle injection, 3 sites, bilaterally. The first injection was done one half way between the occipital protuberance and the tip of the mastoid process behind the ear. The second injection site was done lateral and superior to the first, 1 fingerbreadth from the first injection. The third injection site was 1 fingerbreadth superiorly and medially from the first injection site.  -Cervical paraspinal muscle injection, 2 sites, bilateral knee first injection site was 1 cm from the midline of the cervical spine, 3 cm inferior to the lower border of the occipital protuberance. The second injection site was 1.5 cm superiorly and laterally to the first injection site.  -Trapezius muscle injection was performed at 3 sites, bilaterally. The first injection site was in the upper trapezius muscle halfway between the inflection point of the neck, and the acromion. The second injection site was one half way between the acromion and the first injection site. The third injection was done between the first injection site and the inflection point of the neck.   Will return for repeat injection in 3 months.   155 units of Botox was used, 45 Botox not injected was wasted. The patient tolerated the procedure well, there were no complications of the above procedure.

## 2021-07-12 NOTE — Progress Notes (Signed)
Botox- 200 units x 1 vial Lot: [id number] Expiration: 12/2023 NDC: [id number]  Bacteriostatic 0.9% Sodium Chloride- 58m total Lot: [id number]: 0101/2024 NDC: [id number] Dx [id number]

## 2021-09-27 ENCOUNTER — Other Ambulatory Visit: Payer: Self-pay | Admitting: *Deleted

## 2021-09-27 MED ORDER — BOTOX 200 UNITS IJ SOLR: INTRAMUSCULAR | 1 refills | Status: DC

## 2021-10-06 NOTE — Telephone Encounter (Signed)
Received (1) 200 unit vial of Botox today from [facility] for upcoming December appointment.

## 2021-10-12 ENCOUNTER — Ambulatory Visit: Payer: BC Managed Care – PPO | Admitting: Adult Health

## 2021-10-20 ENCOUNTER — Ambulatory Visit: Payer: BC Managed Care – PPO | Admitting: Adult Health

## 2021-10-20 DIAGNOSIS — G43709 Chronic migraine without aura, not intractable, without status migrainosus: Secondary | ICD-10-CM | POA: Diagnosis not present

## 2021-10-20 NOTE — Progress Notes (Signed)
Botox- 200 units x 1 vial Lot: [id number] Expiration: 06/2024 NDC: 5953-9672-89    0.9% Sodium Chloride- 14mL total Lot: [id number] Expiration: 09/2022 NDC: 36438-377-93  DX G43.709 S/P [facility]

## 2021-10-20 NOTE — Progress Notes (Signed)
° ° °  10/20/21: 2nd Botox procedure.  Reports that she has noticed some benefit in her headaches.    BOTOX PROCEDURE NOTE FOR MIGRAINE HEADACHE    Contraindications and precautions discussed with patient(above). Aseptic procedure was observed and patient tolerated procedure. Procedure performed by Ward Givens, NP  The condition has existed for more than 6 months, and pt does not have a diagnosis of ALS, Myasthenia Gravis or Lambert-Eaton Syndrome.  Risks and benefits of injections discussed and pt agrees to proceed with the procedure.  Written consent obtained   Indication/Diagnosis: chronic migraine BOTOX(J0585) injection was performed according to protocol by [facility]. 200 units of BOTOX was dissolved into 4 cc NS.   NDC: [address]-65  Type of toxin: Botox  Botox- 200 units x 1 vial Lot: [id number] Expiration: 06/2024 NDC: 5284-1324-40    0.9% Sodium Chloride- 80mL total Lot: [id number] Expiration: 09/2022 NDC: 66440-347-42   DX G43.709   Description of procedure:  The patient was placed in a sitting position. The standard protocol was used for Botox as follows, with 5 units of Botox injected at each site:   -Procerus muscle, midline injection  -Corrugator muscle, bilateral injection  -Frontalis muscle, bilateral injection, with 2 sites each side, medial injection was performed in the upper one third of the frontalis muscle, in the region vertical from the medial inferior edge of the superior orbital rim. The lateral injection was again in the upper one third of the forehead vertically above the lateral limbus of the cornea, 1.5 cm lateral to the medial injection site.  -Temporalis muscle injection, 4 sites, bilaterally. The first injection was 3 cm above the tragus of the ear, second injection site was 1.5 cm to 3 cm up from the first injection site in line with the tragus of the ear. The third injection site was 1.5-3 cm forward between the first 2 injection sites. The  fourth injection site was 1.5 cm posterior to the second injection site.  -Occipitalis muscle injection, 3 sites, bilaterally. The first injection was done one half way between the occipital protuberance and the tip of the mastoid process behind the ear. The second injection site was done lateral and superior to the first, 1 fingerbreadth from the first injection. The third injection site was 1 fingerbreadth superiorly and medially from the first injection site.  -Cervical paraspinal muscle injection, 2 sites, bilateral knee first injection site was 1 cm from the midline of the cervical spine, 3 cm inferior to the lower border of the occipital protuberance. The second injection site was 1.5 cm superiorly and laterally to the first injection site.  -Trapezius muscle injection was performed at 3 sites, bilaterally. The first injection site was in the upper trapezius muscle halfway between the inflection point of the neck, and the acromion. The second injection site was one half way between the acromion and the first injection site. The third injection was done between the first injection site and the inflection point of the neck.   Will return for repeat injection in 3 months.   A 200 unit sof Botox was used, 155 units were injected, the rest of the Botox was wasted. The patient tolerated the procedure well, there were no complications of the above procedure.  Ward Givens, MSN, NP-C 10/20/2021, 2:06 PM [facility] [address], [place], [place] [address] [phone]

## 2021-11-28 ENCOUNTER — Encounter: Payer: Self-pay | Admitting: Neurology

## 2021-11-28 DIAGNOSIS — G43709 Chronic migraine without aura, not intractable, without status migrainosus: Secondary | ICD-10-CM

## 2021-11-28 MED ORDER — LAMOTRIGINE 25 MG PO TABS: ORAL_TABLET | ORAL | 3 refills | Status: DC

## 2021-11-28 NOTE — Telephone Encounter (Signed)
Meds ordered this encounter  Medications   lamoTRIgine (LAMICTAL) 25 MG tablet    Sig: One tab twice a day for one week,  2 tabs twice a day    Dispense:  120 tablet    Refill:  3     It is Ok to try Lamotrigine

## 2021-11-29 NOTE — Telephone Encounter (Signed)
[facility].

## 2021-12-19 ENCOUNTER — Telehealth: Payer: Self-pay | Admitting: Adult Health

## 2021-12-19 NOTE — Telephone Encounter (Signed)
Completed BCBS continuation form for Botox. Placed in Nurse Pod for MD signature.

## 2022-01-05 NOTE — Telephone Encounter (Signed)
Faxed signed PA form with OV notes to [facility] @ [phone]. ? ?

## 2022-01-12 NOTE — Telephone Encounter (Signed)
Called [employer] spoke with Sia, states PA was approved PA # [id number] (01/05/2022-12/06/2022). ?

## 2022-01-13 ENCOUNTER — Ambulatory Visit: Payer: BC Managed Care – PPO | Admitting: Adult Health

## 2022-01-13 DIAGNOSIS — G43709 Chronic migraine without aura, not intractable, without status migrainosus: Secondary | ICD-10-CM

## 2022-01-13 NOTE — Progress Notes (Signed)
? ?01/13/22: hasnt keep a headache journal. Knows that botox has helped with neck pain. Encouraged her to keep a journal ? ?10/20/21: 2nd Botox procedure.  Reports that she has noticed some benefit in her headaches.   ? ?BOTOX PROCEDURE NOTE FOR MIGRAINE HEADACHE ? ? ? ?Contraindications and precautions discussed with patient(above). Aseptic procedure was observed and patient tolerated procedure. Procedure performed by Ward Givens, NP ? ?The condition has existed for more than 6 months, and pt does not have a diagnosis of ALS, Myasthenia Gravis or Lambert-Eaton Syndrome.  Risks and benefits of injections discussed and pt agrees to proceed with the procedure.  Written consent obtained ? ?These injections are medically necessary. These injections do not cause sedations or hallucinations which the oral therapies may cause. ? ?Indication/Diagnosis: chronic migraine ?IRWER(X5400) injection was performed according to protocol by [facility]. 200 units of BOTOX was dissolved into 4 cc NS.   ?[place]: [phone] ? ?Type of toxin: Botox ?Botox- 200 units x 1 vial ?Lot: [id number] ?Expiration: 06/2024 ?[place]: [phone] ?  ?Bacteriostatic 0.9% Sodium Chloride- 41m total ?Lot: [id number] ?Expiration: 05/2023 ?[place] [id number]? ?Description of procedure: ? ?The patient was placed in a sitting position. The standard protocol was used for Botox as follows, with 5 units of Botox injected at each site: ? ? ?-Procerus muscle, midline injection ? ?-Corrugator muscle, bilateral injection ? ?-Frontalis muscle, bilateral injection, with 2 sites each side, medial injection was performed in the upper one third of the frontalis muscle, in the region vertical from the medial inferior edge of the superior orbital rim. The lateral injection was again in the upper one third of the forehead vertically above the lateral limbus of the cornea, 1.5 cm lateral to the medial injection site. ? ?-Temporalis muscle injection, 4 sites, bilaterally. The first  injection was 3 cm above the tragus of the ear, second injection site was 1.5 cm to 3 cm up from the first injection site in line with the tragus of the ear. The third injection site was 1.5-3 cm forward between the first 2 injection sites. The fourth injection site was 1.5 cm posterior to the second injection site. ? ?-Occipitalis muscle injection, 3 sites, bilaterally. The first injection was done one half way between the occipital protuberance and the tip of the mastoid process behind the ear. The second injection site was done lateral and superior to the first, 1 fingerbreadth from the first injection. The third injection site was 1 fingerbreadth superiorly and medially from the first injection site. ? ?-Cervical paraspinal muscle injection, 2 sites, bilateral knee first injection site was 1 cm from the midline of the cervical spine, 3 cm inferior to the lower border of the occipital protuberance. The second injection site was 1.5 cm superiorly and laterally to the first injection site. ? ?-Trapezius muscle injection was performed at 3 sites, bilaterally. The first injection site was in the upper trapezius muscle halfway between the inflection point of the neck, and the acromion. The second injection site was one half way between the acromion and the first injection site. The third injection was done between the first injection site and the inflection point of the neck. ? ? ?Will return for repeat injection in 3 months. ? ? ?A 200 unit sof Botox was used, 155 units were injected, the rest of the Botox was wasted. The patient tolerated the procedure well, there were no complications of the above procedure. ? ?MWard Givens MSN, NP-C 01/13/2022, 11:07 AM ?[name] ?[address], [address] ?[place], [place] [address] ?[phone] ? ?

## 2022-01-13 NOTE — Progress Notes (Signed)
Botox- 200 units x 1 vial ?Lot: [id number] ?Expiration: 06/2024 ?[place]: [phone] ? ?Bacteriostatic 0.9% Sodium Chloride- 62m total ?Lot: [id number] ?Expiration: 05/2023 ?[place] [id number]? ?Dx: 05/31/2023 ?S/P  ? ?

## 2022-01-17 NOTE — Telephone Encounter (Signed)
Received (1) 200 unit vial of Botox from [facility]. 

## 2022-04-05 ENCOUNTER — Telehealth: Payer: Self-pay | Admitting: Neurology

## 2022-04-05 NOTE — Telephone Encounter (Signed)
FYI pt called asking her Botox appointment be cx, pt does not feel it has helped.  No request made for a call back

## 2022-04-12 ENCOUNTER — Ambulatory Visit: Payer: BC Managed Care – PPO | Admitting: Adult Health

## 2022-08-16 ENCOUNTER — Telehealth: Payer: Self-pay | Admitting: Gastroenterology

## 2022-08-16 NOTE — Telephone Encounter (Signed)
Good Morning Dr. Lyndel Safe,  Patient called stating that she would like to transfer her care to Dr. Tarri Glenn due to wanting a female provider. Are you okay with Dr. Tarri Glenn taking on patients care? Please advise.  Thank you!

## 2022-08-19 NOTE — Telephone Encounter (Signed)
Absolutely. She will be great hands with Dr. Nicole Kindred

## 2022-08-21 NOTE — Telephone Encounter (Signed)
Dr. Tarri Glenn are you okay with taking on patients care?  Please advise.

## 2022-08-22 NOTE — Telephone Encounter (Signed)
LVM for patient advising the transfer of care had been approved from Dr. Lyndel Safe and Dr. Tarri Glenn.

## 2022-09-19 ENCOUNTER — Encounter: Payer: Self-pay | Admitting: Gastroenterology

## 2022-10-16 ENCOUNTER — Other Ambulatory Visit: Payer: Self-pay | Admitting: Internal Medicine

## 2022-10-16 DIAGNOSIS — E782 Mixed hyperlipidemia: Secondary | ICD-10-CM

## 2022-11-09 ENCOUNTER — Ambulatory Visit: Payer: BC Managed Care – PPO | Admitting: Gastroenterology

## 2022-11-23 ENCOUNTER — Ambulatory Visit
Admission: RE | Admit: 2022-11-23 | Discharge: 2022-11-23 | Disposition: A | Discharge status: Home / Self Care | Payer: BC Managed Care – PPO | Source: Ambulatory Visit | Attending: Internal Medicine | Admitting: Internal Medicine

## 2022-11-23 DIAGNOSIS — E782 Mixed hyperlipidemia: Secondary | ICD-10-CM

## 2023-02-12 ENCOUNTER — Telehealth: Payer: Self-pay | Admitting: *Deleted

## 2023-02-12 ENCOUNTER — Telehealth: Payer: BC Managed Care – PPO | Admitting: Neurology

## 2023-02-12 ENCOUNTER — Encounter: Payer: Self-pay | Admitting: Neurology

## 2023-02-12 ENCOUNTER — Telehealth: Payer: Self-pay | Admitting: Neurology

## 2023-02-12 DIAGNOSIS — G43009 Migraine without aura, not intractable, without status migrainosus: Secondary | ICD-10-CM | POA: Diagnosis not present

## 2023-02-12 MED ORDER — NURTEC 75 MG PO TBDP: 75.0000 mg | ORAL_TABLET | Freq: Every day | ORAL | 11 refills | Status: DC | PRN

## 2023-02-12 MED ORDER — QULIPTA 60 MG PO TABS: 60.0000 mg | ORAL_TABLET | Freq: Every day | ORAL | 11 refills | Status: DC

## 2023-02-12 NOTE — Telephone Encounter (Signed)
F/u megan 6 months fo rmigraine f/u

## 2023-02-12 NOTE — Telephone Encounter (Signed)
Sent mychart msg informing pt of follow up made with Megan 

## 2023-02-12 NOTE — Progress Notes (Signed)
[name]    Provider:  Dr Lucia Gaskins Requesting Provider: [name], MD Primary Care Provider:  [name], MD  CC:  Migraines  Virtual Visit via Video Note  I connected with [name] on 02/12/23 at  8:30 AM EDT by a video enabled telemedicine application and verified that I am speaking with the correct person using two identifiers.  Location: Patient: home Provider: office   I discussed the limitations of evaluation and management by telemedicine and the availability of in person appointments. The patient expressed understanding and agreed to proceed.  Follow Up Instructions:    I discussed the assessment and treatment plan with the patient. The patient was provided an opportunity to ask questions and all were answered. The patient agreed with the plan and demonstrated an understanding of the instructions.   The patient was advised to call back or seek an in-person evaluation if the symptoms worsen or if the condition fails to improve as anticipated.  I provided 30 minutes of non-face-to-face time during this encounter.   [name], MD   02/12/2023: Botox made the headaches worse. Emality failed. Bennie Pierini is something she would like to try. Imitrex is the only thing she can take. Works well and quickly. She has 6 migraine days a month and < 10 total headache days a month. The wether and wind and rain triggers a migraine. Bernita Raisin makes her exhausted. We discussed qulipta as well as other options, sent qulipta to my [facility], PA for nurtec, stay on imitrex, give me feedback on the silicons screws for barometric pressure.    Patient complains of symptoms per HPI as well as the following symptoms: migraines . Pertinent negatives and positives per HPI. All others negative   HPI 05/17/2021:  Heather Copeland is a 52 y.o. female here as requested by [name], MD for migraines.Her migraines are more attributed to hormones, also has pain  in her cervical spine, she is seeing Dr. Venetia Maxon for her neck, she carries a lot of tension in her necks, she is getting dry needling, she has been going to PT. She is going back in August. She was on Emgality but the burn was so bad she couldn't do it, she didn't notice it helped her as much. She has over 16 moderate to severe migraine days a month.  The migraines can be unilateral, pulsating pounding throbbing, nausea and vomiting, movement makes it worse, they can last 24 hours or longer, no medication overuse, no aura, Imitrex helps acutely.  She also has Daily headaches. She is very sensitive with medications. Bernita Raisin makes her tired, imitrex works, could try nurtec. She sleeps well, no significant sleeping issues or snoring.  This has been ongoing for years at this severity and frequency.  She had an MRI because of some left-sided "heaviness" which resolved., MRI was unremarkable.   Reviewed notes, labs and imaging from outside physicians, which showed: personally reviewed MRI images and agree with report of MRI, also reviewed images with patient.  MRI 04/07/2021: 1. No evidence of acute intracranial abnormality. Specifically, no acute infarct. 2. Mild scattered T2/FLAIR hyperintensities within the white matter, which are mildly advanced in number for patient age. This finding is nonspecific but can be seen in the setting of chronic microvascular ischemia, a demyelinating process such as multiple sclerosis, chronic migraines, or as the sequelae of a prior infectious or inflammatory process.  03/31/2021: cbc, bmp normal  Reviewed notes from Dr. Catalina Lunger, specifically medications patient has tried in  the past, agree with list see below: Current and past medications: ANALGESICS: BC/Goody's, Tylenol/Acetaminophen and Vicodin/Vicoprofen.  BP: ramipril, atenolol  Anti-Migraine: Midrin/Duradrin, Imitrex, Maxalt and Relpax-HAs worse.  Decongestant/Antihistamine: sudafed/Pseudoephedrine.  Sleeping  Pills/Tranquilizers: Ambien/Cr/Zolipem and Halcion/Triazolam. Muscle Relaxants: can not tolerate Anti-Depressants: Effexor/Venlafaxine, Tofranil/Imipramine (constipation) and Zoloft/Sertraline. Nortriptyline, Prozac, wellbutrin Antiepileptics: Topamax (foggy), Zonegran (worse HAs) Herbal: Feverfew.  Hormonal: Orthoevra Patch. Other: Emgality side effects, aimovig contrindicated due to constipation  Procedures for headaches: botox   Review of Systems: Patient complains of symptoms per HPI as well as the following symptoms headache. Pertinent negatives and positives per HPI. All others negative.   Social History   Socioeconomic History   Marital status: Married    Spouse name: Not on file   Number of children: Not on file   Years of education: Not on file   Highest education level: Not on file  Occupational History   Not on file  Tobacco Use   Smoking status: Former    Types: Cigarettes    Quit date: 87    Years since quitting: 26.3   Smokeless tobacco: Never  Vaping Use   Vaping Use: Never used  Substance and Sexual Activity   Alcohol use: Not Currently   Drug use: Never   Sexual activity: Yes  Other Topics Concern   Not on file  Social History Narrative   Not on file   Social Determinants of Health   Financial Resource Strain: Not on file  Food Insecurity: Not on file  Transportation Needs: Not on file  Physical Activity: Not on file  Stress: Not on file  Social Connections: Not on file  Intimate Partner Violence: Not on file    Family History  Problem Relation Age of Onset   Stroke Father 73   Heart attack Father    Hypertension Mother    Depression Mother    Anxiety disorder Mother    Depression Sister    Anxiety disorder Sister    Stroke Maternal Grandfather    Heart disease Maternal Grandmother    Breast cancer Maternal Grandmother    Colon cancer Neg Hx    Esophageal cancer Neg Hx    Rectal cancer Neg Hx    Stomach cancer Neg Hx     Past  Medical History:  Diagnosis Date   Allergy    Anxiety    Depression    Hypertension    Migraines    Migraines     Patient Active Problem List   Diagnosis Date Noted   Chronic migraine w/o aura w/o status migrainosus, not intractable 05/17/2021   Family history of breast cancer 11/07/2012   Nipple retraction 11/07/2012   Migraines    Anxiety    Depression     Past Surgical History:  Procedure Laterality Date   C yst removal  10 yrs ago   left middle finger   Disc fusion  3 yrs ago   c5/c6   INCISION AND DRAINAGE ABSCESS     tonsil   TONSILLECTOMY  51yrs ago    Current Outpatient Medications  Medication Sig Dispense Refill   Atogepant (QULIPTA) 60 MG TABS Take 1 tablet (60 mg total) by mouth daily. 30 tablet 11   Rimegepant Sulfate (NURTEC) 75 MG TBDP Take 1 tablet (75 mg total) by mouth daily as needed. For migraines. Take as close to onset of migraine as possible. One daily maximum. 16 tablet 11   Botulinum Toxin Type A (BOTOX) 200 units SOLR Provider to inject 155 units  into the muscles of the head and neck every 3 months. Discard remainder. 1 each 1   Calcium Carb-Cholecalciferol (CALCIUM 500/D) 500-200 MG-UNIT TABS Take by mouth.     fluticasone (FLONASE) 50 MCG/ACT nasal spray Place 2 sprays into the nose daily.     lamoTRIgine (LAMICTAL) 25 MG tablet One tab twice a day for one week,  2 tabs twice a day 120 tablet 3   Magnesium 500 MG CAPS Take by mouth.     Omega 3 1200 MG CAPS Take by mouth.     Probiotic Product (PROBIOTIC ADVANCED PO) Take by mouth.     ramipril (ALTACE) 2.5 MG tablet Take 2.5 mg by mouth daily.     SUMAtriptan (IMITREX) 100 MG tablet Take 100 mg by mouth every 2 (two) hours as needed.     No current facility-administered medications for this visit.    Allergies as of 02/12/2023 - Review Complete 02/12/2023  Allergen Reaction Noted   Sulfa antibiotics  11/07/2012    Vitals: LMP 12/27/2015 (Approximate)  Last Weight:  Wt Readings from  Last 1 Encounters:  05/17/21 130 lb (59 kg)   Last Height:   Ht Readings from Last 1 Encounters:  05/17/21  (1.575 m)     Physical exam: Exam: Gen: NAD, conversant      CV:  Denies palpitations or chest pain or SOB. VS: Breathing at a normal rate. Weight appears within normal limits. Not febrile. Eyes: Conjunctivae clear without exudates or hemorrhage  Neuro: Detailed Neurologic Exam  Speech:    Speech is normal; fluent and spontaneous with normal comprehension.  Cognition:    The patient is oriented to person, place, and time;     recent and remote memory intact;     language fluent;     normal attention, concentration,     fund of knowledge Cranial Nerves:    The pupils are equal, round, and reactive to light.Visual fields are full to finger confrontation. Extraocular movements are intact.  The face is symmetric with normal sensation. The palate elevates in the midline. Hearing intact. Voice is normal. Shoulder shrug is normal. The tongue has normal motion without fasciculations.   Coordination:    Normal finger to nose  Gait:    Normal native gait  Motor Observation:   no involuntary movements noted. Tone:    Appears normal  Posture:    Posture is normal. normal erect    Strength:    Strength is anti-gravity and symmetric in the upper and lower limbs.      Sensation: intact to LT     Reflex Exam:    Assessment/Plan: This is a patient with migraines.  She is tried multiple medications, ongoing chronic migraines for years, has been to other headache centers, she is tried and failed multiple medications including the CGRP injections which she cannot take due to side effects, we discussed trying atogepant/qulipta  Nurtec, Botox made it worse, we discussed options.  - 6 migraine days a month. < 10 total headache days a month. Failed atenolol,emgality, topamax, ramipril,imitrex, maxalt, relpax, effexor,amotriptyline, botox. Start Qulipta prevemtion and Nurtec  acute - see total list above of many more failed medications -MRI showed nonspecific white matter changes likely related to chronic microvascular ischemia or more likely migraines.  I reviewed this with patient and reassured her. -Continue Imitrex acute - nurtec can use as needed or in advance of migraine as discuss take it if you think you;ll have a migraine 12 hour half  life - Next we could try Ajovy SYRINGE  Meds ordered this encounter  Medications   Rimegepant Sulfate (NURTEC) 75 MG TBDP    Sig: Take 1 tablet (75 mg total) by mouth daily as needed. For migraines. Take as close to onset of migraine as possible. One daily maximum.    Dispense:  16 tablet    Refill:  11    6 migraine days a month. < 10 total headache days a month. Failed atenolol,emgality, topamax, ramipril,imitrex, maxalt, relpax, effexor,amotriptyline, botox   Atogepant (QULIPTA) 60 MG TABS    Sig: Take 1 tablet (60 mg total) by mouth daily.    Dispense:  30 tablet    Refill:  11    6 migraine days a month. < 10 total headache days a month. Failed atenolol,emgality, topamax, ramipril,imitrex, maxalt, relpax, effexor,amotriptyline, botox    To prevent or relieve headaches, try the following: Cool Compress. Lie down and place a cool compress on your head.  Avoid headache triggers. If certain foods or odors seem to have triggered your migraines in the past, avoid them. A headache diary might help you identify triggers.  Include physical activity in your daily routine. Try a daily walk or other moderate aerobic exercise.  Manage stress. Find healthy ways to cope with the stressors, such as delegating tasks on your to-do list.  Practice relaxation techniques. Try deep breathing, yoga, massage and visualization.  Eat regularly. Eating regularly scheduled meals and maintaining a healthy diet might help prevent headaches. Also, drink plenty of fluids.  Follow a regular sleep schedule. Sleep deprivation might contribute to  headaches Consider biofeedback. With this mind-body technique, you learn to control certain bodily functions -- such as muscle tension, heart rate and blood pressure -- to prevent headaches or reduce headache pain.    Proceed to emergency room if you experience new or worsening symptoms or symptoms do not resolve, if you have new neurologic symptoms or if headache is severe, or for any concerning symptom.   Provided education and documentation from American headache Society toolbox including articles on: chronic migraine medication overuse headache, chronic migraines, prevention of migraines, behavioral and other nonpharmacologic treatments for headache.  Cc: [name], MD,  [name], MD  Naomie Dean, MD  [facility] [address] [place], [place] [address]  Phone [phone] Fax [phone]
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
## 2023-02-12 NOTE — Telephone Encounter (Signed)
Received Nurtec PA to complete

## 2023-02-15 ENCOUNTER — Other Ambulatory Visit (HOSPITAL_COMMUNITY): Payer: Self-pay

## 2023-02-15 ENCOUNTER — Telehealth: Payer: Self-pay

## 2023-02-15 NOTE — Telephone Encounter (Signed)
Pharmacy Patient Advocate Encounter   Received notification from [facility] that prior authorization for Qulipta  tablets is required/requested.   PA submitted on 02/15/2023 to (ins) [employer] via [employer] or [facility]) confirmation # [id number] Status is pending

## 2023-02-15 NOTE — Telephone Encounter (Signed)
Pharmacy Patient Advocate Encounter   Received notification from [facility] that prior authorization for Nurtec  dispersible tablets is required/requested.   PA submitted on 02/15/2023 to (ins)  [employer] via [employer] or (Medicaid) confirmation # [id number]  Status is pending

## 2023-02-16 ENCOUNTER — Telehealth: Payer: Self-pay

## 2023-02-16 ENCOUNTER — Other Ambulatory Visit (HOSPITAL_COMMUNITY): Payer: Self-pay

## 2023-02-16 NOTE — Telephone Encounter (Signed)
Pharmacy Patient Advocate Encounter  Prior Authorization for Nurtec  dispersible tablets has been approved by [employer] (ins).    PA # PA Case ID #: [id number] Effective dates: 02/16/2023 through 02/14/2024

## 2023-02-16 NOTE — Telephone Encounter (Signed)
PA for Nurtec has been approved.

## 2023-02-16 NOTE — Telephone Encounter (Signed)
Pharmacy Patient Advocate Encounter  Prior Authorization for Qulipta  tablets has been approved by 02/16/2023 (ins).    PA # PA Case ID: [id number] Effective dates: 02/16/2023 through 05/18/2023

## 2023-02-20 NOTE — Telephone Encounter (Signed)
Fax confirmation received for [facility] for qulipta [phone].

## 2023-03-02 ENCOUNTER — Encounter: Payer: Self-pay | Admitting: Neurology

## 2023-03-06 MED ORDER — QULIPTA 10 MG PO TABS: 10.0000 mg | ORAL_TABLET | Freq: Every day | ORAL | 5 refills | Status: DC

## 2023-05-26 IMAGING — MR MR HEAD W/O CM
11 series · 48 of 48 positions shown · non-contrast
Comparison: CT head March 31, 2021.

CLINICAL DATA: Left-sided headache and vertigo.  Left arm weakness.

EXAM:
MRI HEAD WITHOUT CONTRAST
TECHNIQUE: Multiplanar, multiecho pulse sequences of the brain and surrounding
structures were obtained without intravenous contrast.

[Series 5: T1 · sagittal · 4.0mm · 0.75mm/px · 2 of 31 slices shown (1 of 2)]
[im 1/31]
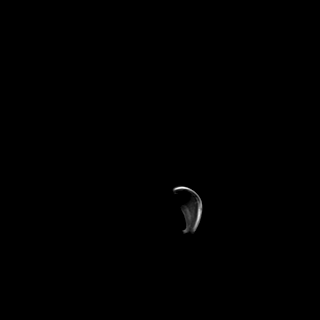
[im 31/31]
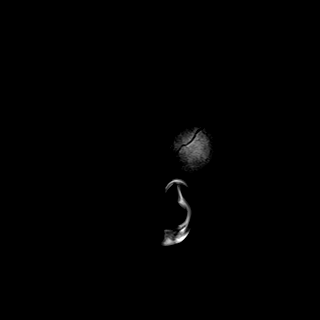

[Series 6: DWI · axial · 3.0mm · 0.94mm/px · z∈[-64,+75]mm · 10 of 160 slices shown (1 of 3)]
[im 1/160]
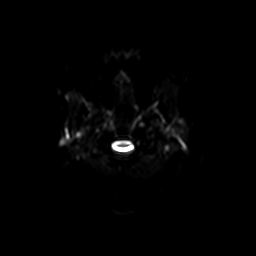
[im 18/160]
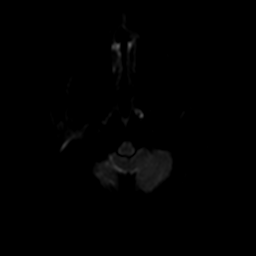
[im 36/160]
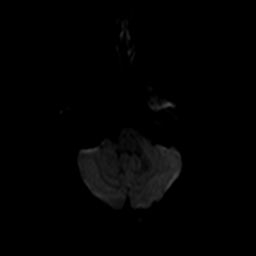
[im 54/160]
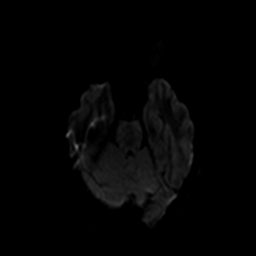
[im 71/160]
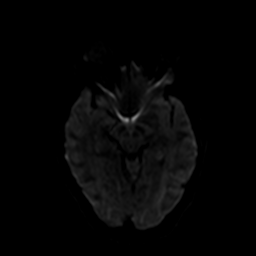
[im 89/160]
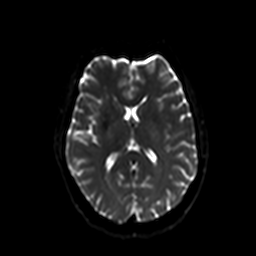
[im 107/160]
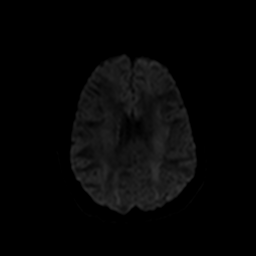
[im 124/160]
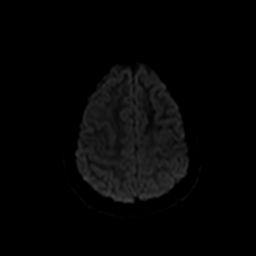
[im 142/160]
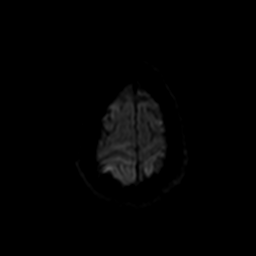
[im 160/160]
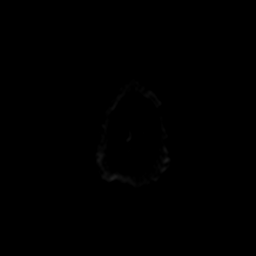

[Series 7: ax dwi_tracew · axial · 3.0mm · 0.94mm/px · z∈[-64,+75]mm · 5 of 80 slices shown]
[im 1/80]
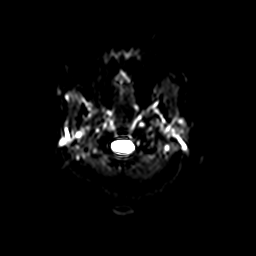
[im 20/80]
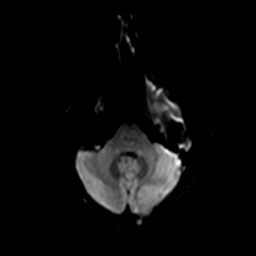
[im 40/80]
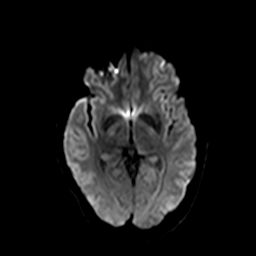
[im 60/80]
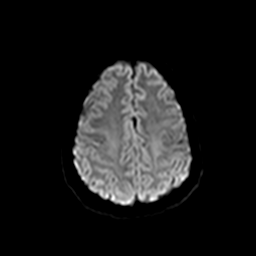
[im 80/80]
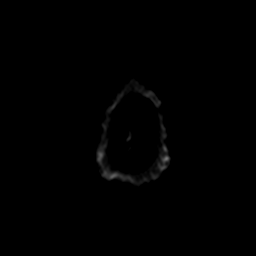

[Series 8: ax dwi_adc · axial · 3.0mm · 0.94mm/px · z∈[-64,+75]mm · 3 of 40 slices shown]
[im 1/40]
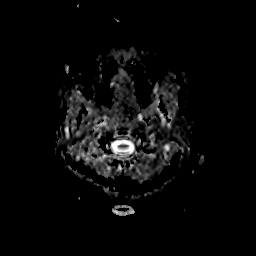
[im 20/40]
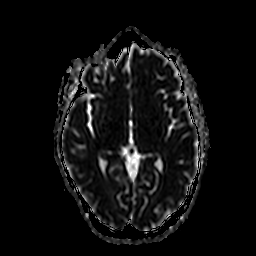
[im 40/40]
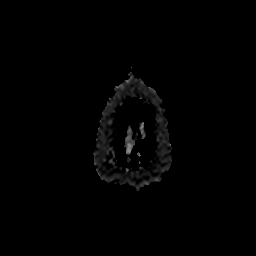

[Series 9: DWI · coronal · 5.0mm · 1.44mm/px · 4 of 64 slices shown (2 of 3)]
[im 1/64]
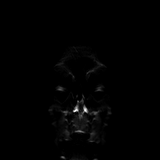
[im 22/64]
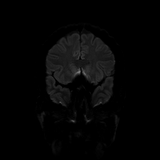
[im 43/64]
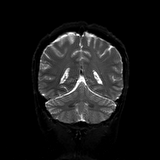
[im 64/64]
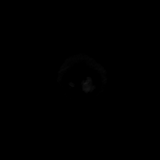

[Series 10: DWI · coronal · 5.0mm · 1.44mm/px · 2 of 32 slices shown (3 of 3)]
[im 1/32]
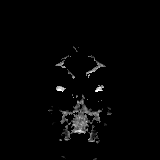
[im 32/32]
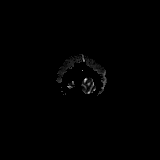

[Series 11: T2 · axial · 4.0mm · 0.36mm/px · z∈[-61,+84]mm · 2 of 29 slices shown (1 of 2)]
[im 1/29]
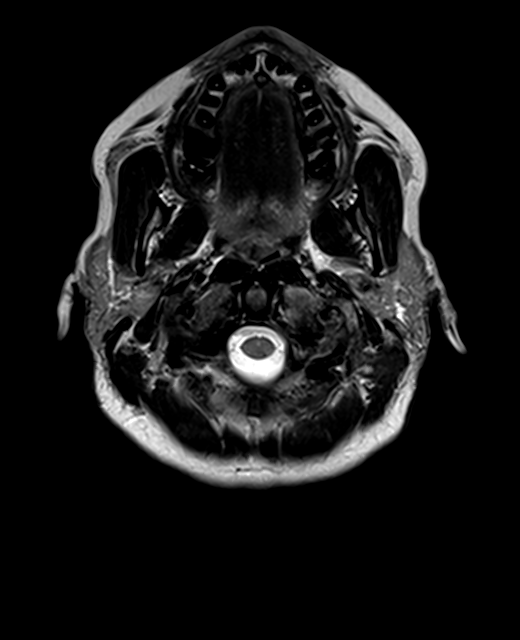
[im 29/29]
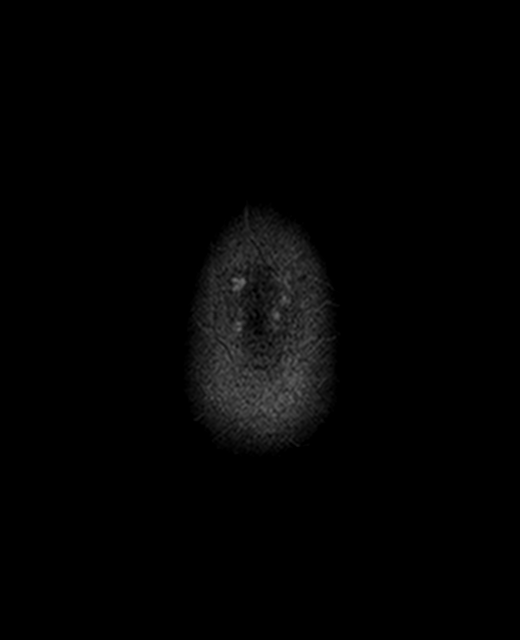

[Series 12: FLAIR · axial · 3.0mm · 0.72mm/px · z∈[-63,+86]mm · 2 of 26 slices shown]
[im 1/26]
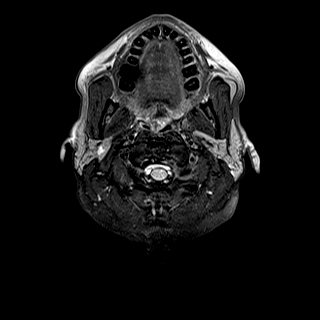
[im 26/26]
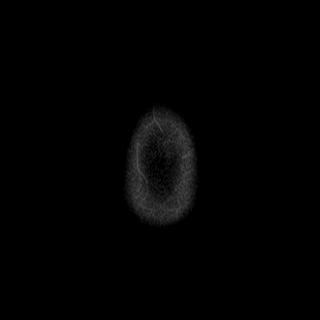

[Series 13: swi_images · axial · 1.5mm · 0.90mm/px · z∈[-60,+82]mm · 6 of 96 slices shown]
[im 1/96]
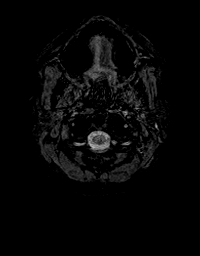
[im 20/96]
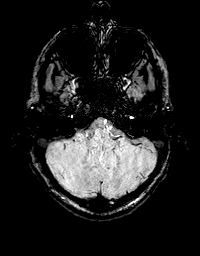
[im 39/96]
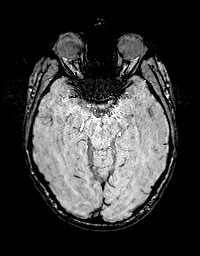
[im 58/96]
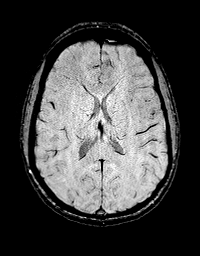
[im 77/96]
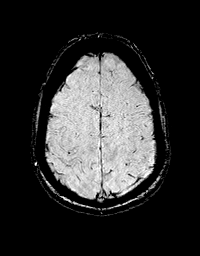
[im 96/96]
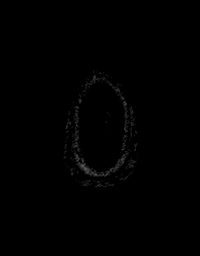

[Series 15: T1 · axial · 1.0mm · 0.94mm/px · z∈[-79,+79]mm · 10 of 160 slices shown (2 of 2)]
[im 1/160]
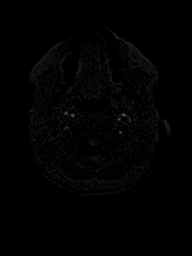
[im 18/160]
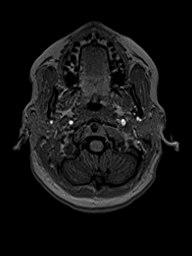
[im 36/160]
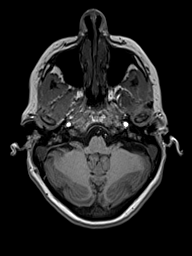
[im 54/160]
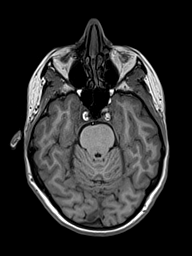
[im 71/160]
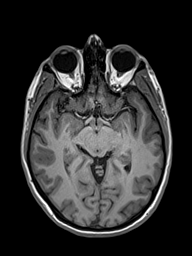
[im 89/160]
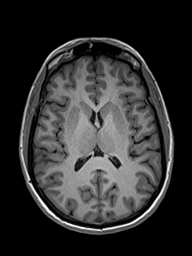
[im 107/160]
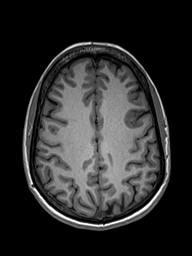
[im 124/160]
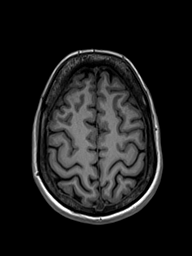
[im 142/160]
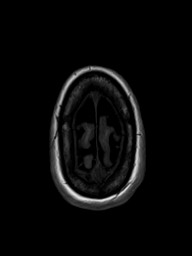
[im 160/160]
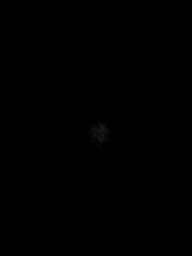

[Series 16: T2 · coronal · 4.5mm · 0.36mm/px · 2 of 30 slices shown (2 of 2)]
[im 1/30]
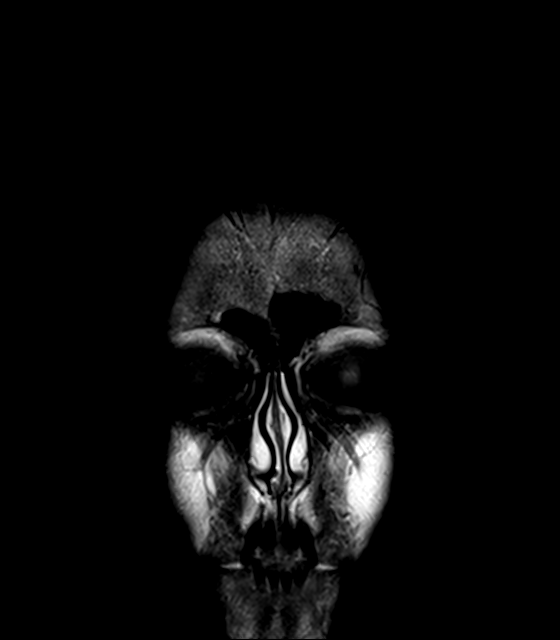
[im 30/30]
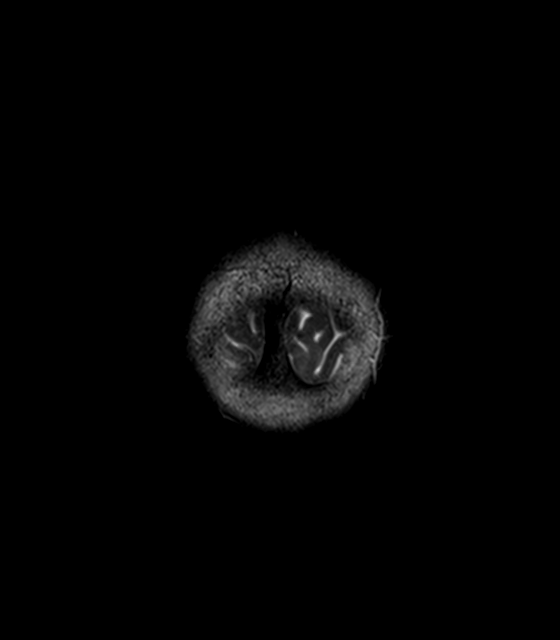

[48 of 48 positions shown; findings below may reference images not displayed]

FINDINGS: Brain: No acute infarction, hemorrhage, hydrocephalus, extra-axial
collection or mass lesion. Mild scattered T2/FLAIR hyperintensities
within the white matter, which are mildly advanced in number for
patient age. Conspicuous arachnoid granulations along the posterior
aspect of the posterior fossa along the transverse sinuses.

Vascular: Major arterial flow voids are maintained at the skull
base.

Skull and upper cervical spine: Normal marrow signal.

Sinuses/Orbits: Clear sinuses.  Unremarkable orbits.

Other: No mastoid effusions.
IMPRESSION: 1. No evidence of acute intracranial abnormality. Specifically, no
acute infarct.
2. Mild scattered T2/FLAIR hyperintensities within the white matter,
which are mildly advanced in number for patient age. This finding is
nonspecific but can be seen in the setting of chronic microvascular
ischemia, a demyelinating process such as multiple sclerosis,
chronic migraines, or as the sequelae of a prior infectious or
inflammatory process.

## 2023-08-13 ENCOUNTER — Encounter: Payer: Self-pay | Admitting: Adult Health

## 2023-08-13 ENCOUNTER — Ambulatory Visit: Payer: BC Managed Care – PPO | Admitting: Adult Health

## 2023-08-13 VITALS — BP 118/82 | HR 68 | Ht 62.0 in | Wt 122.0 lb

## 2023-08-13 DIAGNOSIS — G43009 Migraine without aura, not intractable, without status migrainosus: Secondary | ICD-10-CM

## 2023-08-13 MED ORDER — EMGALITY 120 MG/ML ~~LOC~~ SOSY: 120.0000 mg | PREFILLED_SYRINGE | SUBCUTANEOUS | 11 refills | Status: DC

## 2023-08-13 NOTE — Progress Notes (Signed)
PATIENT: Heather Copeland DOB: 11-15-1970  REASON FOR VISIT: follow up HISTORY FROM: patient PRIMARY NEUROLOGIST: Dr. Lucia Gaskins   Chief Complaint  Patient presents with   Follow-up    RM 20 alone Pt is well, reports she was unable to torlerate Qulipta. She is interested in starting Spaulding again.  She is currently having about 5-10 migraines per month.       HISTORY OF PRESENT ILLNESS: Today 08/13/23  Heather Copeland is a 52 y.o. female who has been followed in this office for migraine headaches. Returns today for follow-up.  Reports having 5-10 migraines a month.  Imitrex works well.  She prefers Imitrex or Nurtec if she can take it at work.  Nurtec makes her feel tired and nauseated.  She was on Qulipta but it also made her feel too tired.  Initially she was on Emgality and she felt that it worked well but she did not like how it burned when injected.  She would like to switch back to this.  HISTORY 02/12/2023: Botox made the headaches worse. Emality failed. Bennie Pierini is something she would like to try. Imitrex is the only thing she can take. Works well and quickly. She has 6 migraine days a month and < 10 total headache days a month. The wether and wind and rain triggers a migraine. Bernita Raisin makes her exhausted. We discussed qulipta as well as other options, sent qulipta to my scripts, PA for nurtec, stay on imitrex, give me feedback on the silicons screws for barometric pressure.     Patient complains of symptoms per HPI as well as the following symptoms: migraines . Pertinent negatives and positives per HPI. All others negative     HPI 05/17/2021:  Heather Copeland is a 52 y.o. female here as requested by [name], MD for migraines.Her migraines are more attributed to hormones, also has pain in her cervical spine, she is seeing Dr. Venetia Maxon for her neck, she carries a lot of tension in her necks, she is getting dry needling, she has been going to PT. She is going back in August. She  was on Emgality but the burn was so bad she couldn't do it, she didn't notice it helped her as much. She has over 16 moderate to severe migraine days a month.  The migraines can be unilateral, pulsating pounding throbbing, nausea and vomiting, movement makes it worse, they can last 24 hours or longer, no medication overuse, no aura, Imitrex helps acutely.  She also has Daily headaches. She is very sensitive with medications. Bernita Raisin makes her tired, imitrex works, could try nurtec. She sleeps well, no significant sleeping issues or snoring.  This has been ongoing for years at this severity and frequency.   She had an MRI because of some left-sided "heaviness" which resolved., MRI was unremarkable.    Reviewed notes, labs and imaging from outside physicians, which showed: personally reviewed MRI images and agree with report of MRI, also reviewed images with patient.   MRI 04/07/2021: 1. No evidence of acute intracranial abnormality. Specifically, no acute infarct. 2. Mild scattered T2/FLAIR hyperintensities within the white matter, which are mildly advanced in number for patient age. This finding is nonspecific but can be seen in the setting of chronic microvascular ischemia, a demyelinating process such as multiple sclerosis, chronic migraines, or as the sequelae of a prior infectious or inflammatory process.   03/31/2021: cbc, bmp normal   Reviewed notes from Dr. Catalina Lunger, specifically medications patient has tried in  the past, agree with list see below: Current and past medications: ANALGESICS: BC/Goody's, Tylenol/Acetaminophen and Vicodin/Vicoprofen.  BP: ramipril, atenolol  Anti-Migraine: Midrin/Duradrin, Imitrex, Maxalt and Relpax-HAs worse.  Decongestant/Antihistamine: sudafed/Pseudoephedrine.  Sleeping Pills/Tranquilizers: Ambien/Cr/Zolipem and Halcion/Triazolam. Muscle Relaxants: can not tolerate Anti-Depressants: Effexor/Venlafaxine, Tofranil/Imipramine (constipation) and  Zoloft/Sertraline. Nortriptyline, Prozac, wellbutrin Antiepileptics: Topamax (foggy), Zonegran (worse HAs) Herbal: Feverfew.  Hormonal: Orthoevra Patch. Other: Emgality side effects, aimovig contrindicated due to constipation  Procedures for headaches: botox    REVIEW OF SYSTEMS: Out of a complete 14 system review of symptoms, the patient complains only of the following symptoms, and all other reviewed systems are negative.  ALLERGIES: Allergies  Allergen Reactions   Sulfa Antibiotics     Headaches     HOME MEDICATIONS: Outpatient Medications Prior to Visit  Medication Sig Dispense Refill   Calcium Carb-Cholecalciferol (CALCIUM 500/D) 500-200 MG-UNIT TABS Take by mouth.     fluticasone (FLONASE) 50 MCG/ACT nasal spray Place 2 sprays into the nose daily.     Magnesium 500 MG CAPS Take by mouth.     Omega 3 1200 MG CAPS Take by mouth.     Probiotic Product (PROBIOTIC ADVANCED PO) Take by mouth.     ramipril (ALTACE) 2.5 MG tablet Take 2.5 mg by mouth daily.     Rimegepant Sulfate (NURTEC) 75 MG TBDP Take 1 tablet (75 mg total) by mouth daily as needed. For migraines. Take as close to onset of migraine as possible. One daily maximum. 16 tablet 11   SUMAtriptan (IMITREX) 100 MG tablet Take 100 mg by mouth every 2 (two) hours as needed.     Atogepant (QULIPTA) 10 MG TABS Take 10 mg by mouth daily. 30 tablet 5   Botulinum Toxin Type A (BOTOX) 200 units SOLR Provider to inject 155 units into the muscles of the head and neck every 3 months. Discard remainder. 1 each 1   lamoTRIgine (LAMICTAL) 25 MG tablet One tab twice a day for one week,  2 tabs twice a day 120 tablet 3   No facility-administered medications prior to visit.    PAST MEDICAL HISTORY: Past Medical History:  Diagnosis Date   Allergy    Anxiety    Depression    Hypertension    Migraines    Migraines     PAST SURGICAL HISTORY: Past Surgical History:  Procedure Laterality Date   C yst removal  10 yrs ago   left  middle finger   Disc fusion  3 yrs ago   c5/c6   INCISION AND DRAINAGE ABSCESS     tonsil   TONSILLECTOMY  27yrs ago    FAMILY HISTORY: Family History  Problem Relation Age of Onset   Stroke Father 32   Heart attack Father    Hypertension Mother    Depression Mother    Anxiety disorder Mother    Depression Sister    Anxiety disorder Sister    Stroke Maternal Grandfather    Heart disease Maternal Grandmother    Breast cancer Maternal Grandmother    Colon cancer Neg Hx    Esophageal cancer Neg Hx    Rectal cancer Neg Hx    Stomach cancer Neg Hx     SOCIAL HISTORY: Social History   Socioeconomic History   Marital status: Married    Spouse name: Not on file   Number of children: Not on file   Years of education: Not on file   Highest education level: Not on file  Occupational History   Not  on file  Tobacco Use   Smoking status: Former    Current packs/day: 0.00    Types: Cigarettes    Quit date: 1998    Years since quitting: 26.8   Smokeless tobacco: Never  Vaping Use   Vaping status: Never Used  Substance and Sexual Activity   Alcohol use: Not Currently   Drug use: Never   Sexual activity: Yes  Other Topics Concern   Not on file  Social History Narrative   Not on file   Social Determinants of Health   Financial Resource Strain: Not on file  Food Insecurity: Not on file  Transportation Needs: Not on file  Physical Activity: Not on file  Stress: Not on file  Social Connections: Not on file  Intimate Partner Violence: Not on file      PHYSICAL EXAM  Vitals:   08/13/23 1413  BP: 118/82  Pulse: 68  Weight: 122 lb (55.3 kg)  Height: 5\' 2"  (1.575 m)   Body mass index is 22.31 kg/m.  Generalized: Well developed, in no acute distress   Neurological examination  Mentation: Alert oriented to time, place, history taking. Follows all commands speech and language fluent Cranial nerve II-XII: Pupils were equal round reactive to light. Extraocular  movements were full, visual field were full on confrontational test. Facial sensation and strength were normal. Uvula tongue midline. Head turning and shoulder shrug  were normal and symmetric. Motor: The motor testing reveals 5 over 5 strength of all 4 extremities. Good symmetric motor tone is noted throughout.  Sensory: Sensory testing is intact to soft touch on all 4 extremities. No evidence of extinction is noted.  Coordination: Cerebellar testing reveals good finger-nose-finger and heel-to-shin bilaterally.  Gait and station: Gait is normal.   DIAGNOSTIC DATA (LABS, IMAGING, TESTING) - I reviewed patient records, labs, notes, testing and imaging myself where available.  Lab Results  Component Value Date   WBC 6.7 03/31/2021   HGB 13.1 03/31/2021   HCT 39.1 03/31/2021   MCV 92.7 03/31/2021   PLT 285 03/31/2021      Component Value Date/Time   NA 139 03/31/2021 1456   K 3.8 03/31/2021 1456   CL 103 03/31/2021 1456   CO2 27 03/31/2021 1456   GLUCOSE 92 03/31/2021 1456   BUN 17 03/31/2021 1456   CREATININE 0.85 03/31/2021 1456   CREATININE 0.80 11/14/2014 1518   CALCIUM 9.5 03/31/2021 1456   PROT 7.5 11/14/2014 1518   ALBUMIN 4.1 11/14/2014 1518   AST 26 11/14/2014 1518   ALT 21 11/14/2014 1518   ALKPHOS 56 11/14/2014 1518   BILITOT 0.3 11/14/2014 1518   GFRNONAA >60 03/31/2021 1456    Lab Results  Component Value Date   TSH 0.836 11/14/2014      ASSESSMENT AND PLAN 52 y.o. year old female  has a past medical history of Allergy, Anxiety, Depression, Hypertension, Migraines, and Migraines. here with:  1.  Migraine headaches  -Start Emgality 120 mg monthly injection will try the syringe to see if it is easier to tolerate (regarding burning with the injection.) -Continue Imitrex or Nurtec for abortive therapy -Advised if symptoms worsen she should let us know -Follow-up in 3 months or sooner if needed    Butch Penny, MSN, NP-C 08/13/2023, 2:33 PM [facility] [address], [address] [place], [place] [address] [phone]

## 2023-08-13 NOTE — Patient Instructions (Signed)
Your Plan:  Try Emgality Syringe monthly injection  If your symptoms worsen or you develop new symptoms please let us know.   Thank you for coming to see [place] at [facility]. I hope we have been able to provide you high quality care today.  You may receive a patient satisfaction survey over the next few weeks. We would appreciate your feedback and comments so that we may continue to improve ourselves and the health of our patients.

## 2023-08-20 ENCOUNTER — Other Ambulatory Visit (HOSPITAL_COMMUNITY): Payer: Self-pay

## 2023-08-20 ENCOUNTER — Encounter: Payer: Self-pay | Admitting: Adult Health

## 2023-08-20 ENCOUNTER — Telehealth: Payer: Self-pay | Admitting: *Deleted

## 2023-08-20 NOTE — Telephone Encounter (Signed)
No loading dose.

## 2023-08-20 NOTE — Telephone Encounter (Signed)
Will the patient need the loading dose? Please advise-Thanks.

## 2023-08-20 NOTE — Telephone Encounter (Signed)
Patient states Emgality needs a PA.

## 2023-08-22 ENCOUNTER — Telehealth: Payer: Self-pay

## 2023-08-22 ENCOUNTER — Other Ambulatory Visit (HOSPITAL_COMMUNITY): Payer: Self-pay

## 2023-08-22 NOTE — Telephone Encounter (Signed)
Pharmacy Patient Advocate Encounter  Received notification from [facility] that Prior Authorization for Emgality 120MG /ML syringes (migraine) has been APPROVED from 08-22-2023 to 11-22-2023   PA #/Case ID/Reference #: [id number]

## 2023-08-22 NOTE — Telephone Encounter (Signed)
PA request has been Submitted. New Encounter created for follow up. For additional info see Pharmacy Prior Auth telephone encounter from 08/22/2023.  Thank you for that confirmation.

## 2023-08-22 NOTE — Telephone Encounter (Signed)
Pharmacy Patient Advocate Encounter   Received notification from Physician's Office that prior authorization for Emgality 120MG /ML syringes (migraine) is required/requested.   Insurance verification completed.   The patient is insured through [facility] .   Per test claim: PA required; PA submitted to [facility] via CoverMyMeds Key/confirmation #/EOC [id number] Status is pending

## 2023-11-06 ENCOUNTER — Telehealth: Payer: Self-pay | Admitting: Adult Health

## 2023-11-06 NOTE — Telephone Encounter (Signed)
 Pt canceled app states everything is going well. Will call back to r/s if need be

## 2023-11-16 ENCOUNTER — Telehealth: Payer: Self-pay | Admitting: Adult Health

## 2023-12-06 ENCOUNTER — Other Ambulatory Visit (HOSPITAL_COMMUNITY): Payer: Self-pay

## 2023-12-06 ENCOUNTER — Telehealth: Payer: Self-pay | Admitting: Pharmacy Technician

## 2023-12-06 NOTE — Telephone Encounter (Signed)
 Pharmacy Patient Advocate Encounter   Received notification from CoverMyMeds that prior authorization for Emgality  120MG /ML syringes (migraine) is required/requested.   Insurance verification completed.   The patient is insured through [facility] .   Per test claim: PA required; PA submitted to above mentioned insurance via CoverMyMeds Key/confirmation #/EOC [id number] Status is pending

## 2023-12-06 NOTE — Telephone Encounter (Signed)
 Pharmacy Patient Advocate Encounter  Received notification from [facility] that Prior Authorization for Emgality  has been APPROVED from 12/06/2023 to 12/05/2024. Ran test claim, Copay is $35.00. This test claim was processed through [facility]- copay amounts may vary at other pharmacies due to pharmacy/plan contracts, or as the patient moves through the different stages of their insurance plan.   [employer] , the mfr of EMGALITY  120 MG/ML SYRINGE paid 12.00 toward your copay. $4888.00 out of $4900.00 in benefits remaining

## 2024-02-07 ENCOUNTER — Ambulatory Visit: Payer: Self-pay | Admitting: Sports Medicine

## 2024-02-07 VITALS — BP 122/80 | Ht 62.0 in | Wt 120.0 lb

## 2024-02-07 DIAGNOSIS — M79671 Pain in right foot: Secondary | ICD-10-CM | POA: Diagnosis not present

## 2024-02-07 DIAGNOSIS — M25579 Pain in unspecified ankle and joints of unspecified foot: Secondary | ICD-10-CM | POA: Diagnosis not present

## 2024-02-07 DIAGNOSIS — M79672 Pain in left foot: Secondary | ICD-10-CM | POA: Diagnosis not present

## 2024-02-07 NOTE — Progress Notes (Signed)
 HPI: Patient is presenting for some right foot pain.  Patient had orthotics made here approximately 20 years ago.  Patient does have some pain over the distal 2nd and 3rd toes more so located at the proximal phalangeal area.  Patient is an avid runner and would like to be able to run without any pain.  Patient otherwise has no other concerns at this time.  Physical exam: Bilateral foot inspection reveals loss of transverse arch bilaterally with the right being greater than the left.  There is noted hammering of toes 3 4 and 5 bilaterally.  Longitudinal arch has also been decreased to a degree.  There is splaying noted of toes 1 and 2 bilaterally.  There is some changes of the knuckle pads on toes 3 4 and 5 on the right foot and 4 and 5 on the left.  Patient was fitted for a : Fit and run orthotic.    The orthotic was heated, placed on the orthotic stand. The patient was positioned in subtalar neutral position and 10 degrees of ankle dorsiflexion in a weight bearing stance on the heated orthotic blank After completion of molding, a stable base was applied to the orthotic blank. The blank was ground to a stable position for weight bearing. Blank: Fit n Run Base: None Posting: Metatarsal pad small  Brenton Grills, MD  I observed and examined the patient with the [facility] resident and agree with assessment and plan.  Note reviewed and modified by me. Sterling Big, MD

## 2024-02-07 NOTE — Assessment & Plan Note (Signed)
 Great response to orthotics for > 10 years with our last pair New orthotics today and use consistently for running

## 2024-02-22 ENCOUNTER — Other Ambulatory Visit (HOSPITAL_COMMUNITY): Payer: Self-pay

## 2024-02-25 ENCOUNTER — Other Ambulatory Visit (HOSPITAL_COMMUNITY): Payer: Self-pay

## 2024-04-07 ENCOUNTER — Ambulatory Visit: Admitting: Family Medicine

## 2024-04-07 VITALS — BP 128/78 | Ht 62.0 in | Wt 120.0 lb

## 2024-04-07 DIAGNOSIS — M25562 Pain in left knee: Secondary | ICD-10-CM | POA: Diagnosis not present

## 2024-04-07 NOTE — Patient Instructions (Signed)
 You have a popliteus muscle strain/spasms. Start physical therapy at [facility] and do home exercises on days you don't go to therapy. Aleve if needed for pain and inflammation. Icing (or heat at this point) 15 minutes at a time as needed. PT will help get you back into running. Follow up with me in 1 month.

## 2024-04-08 ENCOUNTER — Encounter: Payer: Self-pay | Admitting: Family Medicine

## 2024-04-08 NOTE — Progress Notes (Signed)
 PCP: Avva, Ravisankar, MD  Subjective:   HPI: Patient is a 53 y.o. female here for left knee pain.  Patient reports she's been running since the end of last year after several year history of being unable to run. Leading up to May 19th had no problems. Then on this day she ran 4 miles and afterwards felt posterior left knee pain in central aspect of this knee. Knee will lock up on her at times. Has tried icing, ibuprofen. Previously did physical therapy but not for this condition.  Past Medical History:  Diagnosis Date   Allergy    Anxiety    Depression    Hypertension    Migraines    Migraines     Current Outpatient Medications on File Prior to Visit  Medication Sig Dispense Refill   Calcium Carb-Cholecalciferol (CALCIUM 500/D) 500-200 MG-UNIT TABS Take by mouth.     fluticasone (FLONASE) 50 MCG/ACT nasal spray Place 2 sprays into the nose daily.     Galcanezumab -gnlm (EMGALITY ) 120 MG/ML SOSY Inject 120 mg into the skin every 30 (thirty) days. 1.12 mL 11   Magnesium 500 MG CAPS Take by mouth.     Omega 3 1200 MG CAPS Take by mouth.     Probiotic Product (PROBIOTIC ADVANCED PO) Take by mouth.     ramipril (ALTACE) 2.5 MG tablet Take 2.5 mg by mouth daily.     Rimegepant Sulfate (NURTEC) 75 MG TBDP Take 1 tablet (75 mg total) by mouth daily as needed. For migraines. Take as close to onset of migraine as possible. One daily maximum. 16 tablet 11   SUMAtriptan (IMITREX) 100 MG tablet Take 100 mg by mouth every 2 (two) hours as needed.     No current facility-administered medications on file prior to visit.    Past Surgical History:  Procedure Laterality Date   C yst removal  10 yrs ago   left middle finger   Disc fusion  3 yrs ago   c5/c6   INCISION AND DRAINAGE ABSCESS     tonsil   TONSILLECTOMY  60yrs ago    Allergies  Allergen Reactions   Sulfa Antibiotics     Headaches     BP 128/78 (BP Location: Left Arm, Patient Position: Sitting, Cuff Size: Normal)   Ht  5\' 2"  (1.575 m)   Wt 120 lb (54.4 kg)   LMP 12/27/2015 (Approximate)   BMI 21.95 kg/m       No data to display              No data to display              Objective:  Physical Exam:  Gen: NAD, comfortable in exam room  Left knee: No gross deformity, ecchymoses, swelling. TTP Popliteal fossa, less lateral hamstring tendon. FROM with normal strength - no pain resisted knee flexion. Negative ant/post drawers. Negative valgus/varus testing. Negative lachman.  Negative mcmurrays, apleys, thessalys. NV intact distally.  Limited MSK u/s left knee:  No effusion.  Medial and lateral meniscus appear intact.  No baker's cyst.  Popliteus muscle appears intact also.   Assessment & Plan:  1. Left knee pain - consistent with popliteus muscle strain/spasms.  Start physical therapy and home exercises.  Aleve if needed.  Icing if needed.  Follow up in 1 month.

## 2024-08-25 ENCOUNTER — Other Ambulatory Visit: Payer: Self-pay | Admitting: Adult Health

## 2024-11-25 ENCOUNTER — Other Ambulatory Visit: Payer: Self-pay | Admitting: Adult Health

## 2024-11-26 ENCOUNTER — Telehealth: Payer: Self-pay | Admitting: Adult Health

## 2024-11-26 NOTE — Telephone Encounter (Signed)
 ..  Pt understands that although there may be some limitations with this type of visit, we will take all precautions to reduce any security or privacy concerns.  Pt understands that this will be treated like an in office visit and we will file with pt's insurance, and there may be a patient responsible charge related to this service. ? ?

## 2024-11-28 ENCOUNTER — Telehealth: Payer: Self-pay | Admitting: Adult Health

## 2024-11-28 DIAGNOSIS — G43009 Migraine without aura, not intractable, without status migrainosus: Secondary | ICD-10-CM

## 2024-11-28 MED ORDER — EMGALITY 120 MG/ML ~~LOC~~ SOSY: 120.0000 mg | PREFILLED_SYRINGE | SUBCUTANEOUS | 11 refills | Status: AC

## 2024-11-28 MED ORDER — SUMATRIPTAN SUCCINATE 100 MG PO TABS: ORAL_TABLET | ORAL | 11 refills | Status: AC

## 2024-11-28 NOTE — Progress Notes (Signed)
 "    PATIENT: Heather Copeland DOB: 04-Apr-1971  REASON FOR VISIT: follow up HISTORY FROM: patient  Virtual Visit via Video Note  I connected with Heather Copeland on 11/28/24 at 11:15 AM EST by a video enabled telemedicine application located remotely at [facility] and verified that I am speaking with the correct person using two identifiers who was located at their own home in [place]   I discussed the limitations of evaluation and management by telemedicine and the availability of in person appointments. The patient expressed understanding and agreed to proceed.   PATIENT: Heather Copeland DOB: Jun 09, 1971  REASON FOR VISIT: follow up HISTORY FROM: patient  HISTORY OF PRESENT ILLNESS: Today 11/28/24 Heather Copeland is a 54 y.o. female with a history of migraine headaches. Returns today for follow-up.  At the last visit she switched back to Emgality .  She reports that this been working well for her migraines.  Having minimal migraines. Maybe only 1 migraine in the last couple of months.  She continues to take Imitrex  for abortive therapy.   HISTORY 08/13/23  Heather Copeland is a 54 y.o. female who has been followed in this office for migraine headaches. Returns today for follow-up.  Reports having 5-10 migraines a month.  Imitrex  works well.  She prefers Imitrex  or Nurtec if she can take it at work.  Nurtec makes her feel tired and nauseated.  She was on Qulipta  but it also made her feel too tired.  Initially she was on Emgality  and she felt that it worked well but she did not like how it burned when injected.  She would like to switch back to this.  HISTORY 02/12/2023: Botox  made the headaches worse. Emality failed. Qulipta  is something she would like to try. Imitrex  is the only thing she can take. Works well and quickly. She has 6 migraine days a month and < 10 total headache days a month. The wether and wind and rain triggers a migraine. Holland makes her exhausted. We  discussed qulipta  as well as other options, sent qulipta  to my scripts, PA for nurtec, stay on imitrex , give me feedback on the silicons screws for barometric pressure.     Patient complains of symptoms per HPI as well as the following symptoms: migraines . Pertinent negatives and positives per HPI. All others negative     HPI 05/17/2021:  Heather Copeland is a 54 y.o. female here as requested by Janey Santos, MD for migraines.Her migraines are more attributed to hormones, also has pain in her cervical spine, she is seeing Dr. Unice for her neck, she carries a lot of tension in her necks, she is getting dry needling, she has been going to PT. She is going back in August. She was on Emgality  but the burn was so bad she couldn't do it, she didn't notice it helped her as much. She has over 16 moderate to severe migraine days a month.  The migraines can be unilateral, pulsating pounding throbbing, nausea and vomiting, movement makes it worse, they can last 24 hours or longer, no medication overuse, no aura, Imitrex  helps acutely.  She also has Daily headaches. She is very sensitive with medications. Holland makes her tired, imitrex  works, could try nurtec. She sleeps well, no significant sleeping issues or snoring.  This has been ongoing for years at this severity and frequency.   She had an MRI because of some left-sided heaviness which resolved., MRI was unremarkable.  Reviewed notes, labs and imaging from outside physicians, which showed: personally reviewed MRI images and agree with report of MRI, also reviewed images with patient.   MRI 04/07/2021: 1. No evidence of acute intracranial abnormality. Specifically, no acute infarct. 2. Mild scattered T2/FLAIR hyperintensities within the white matter, which are mildly advanced in number for patient age. This finding is nonspecific but can be seen in the setting of chronic microvascular ischemia, a demyelinating process such as multiple  sclerosis, chronic migraines, or as the sequelae of a prior infectious or inflammatory process.   03/31/2021: cbc, bmp normal   Reviewed notes from Dr. Brutus, specifically medications patient has tried in the past, agree with list see below: Current and past medications: ANALGESICS: BC/Goody's, Tylenol/Acetaminophen and Vicodin/Vicoprofen.  BP: ramipril, atenolol  Anti-Migraine: Midrin/Duradrin, Imitrex , Maxalt and Relpax-HAs worse.  Decongestant/Antihistamine: sudafed/Pseudoephedrine.  Sleeping Pills/Tranquilizers: Ambien/Cr/Zolipem and Halcion/Triazolam. Muscle Relaxants: can not tolerate Anti-Depressants: Effexor/Venlafaxine, Tofranil/Imipramine (constipation) and Zoloft/Sertraline. Nortriptyline, Prozac, wellbutrin Antiepileptics: Topamax (foggy), Zonegran (worse HAs) Herbal: Feverfew.  Hormonal: Orthoevra Patch. Other: Emgality  side effects, aimovig contrindicated due to constipation  Procedures for headaches: botox     REVIEW OF SYSTEMS: Out of a complete 14 system review of symptoms, the patient complains only of the following symptoms, and all other reviewed systems are negative.  ALLERGIES: Allergies[1]  HOME MEDICATIONS: Outpatient Medications Prior to Visit  Medication Sig Dispense Refill   Calcium Carb-Cholecalciferol (CALCIUM 500/D) 500-200 MG-UNIT TABS Take by mouth.     fluticasone (FLONASE) 50 MCG/ACT nasal spray Place 2 sprays into the nose daily.     Magnesium 500 MG CAPS Take by mouth.     Omega 3 1200 MG CAPS Take by mouth.     Probiotic Product (PROBIOTIC ADVANCED PO) Take by mouth.     ramipril (ALTACE) 2.5 MG tablet Take 2.5 mg by mouth daily.     Galcanezumab -gnlm (EMGALITY ) 120 MG/ML SOSY Inject 120 mg into the skin every 30 (thirty) days. *NEED APPOINTMENT FOR FURTHER REFILLS* 1.12 mL 0   Rimegepant Sulfate (NURTEC) 75 MG TBDP Take 1 tablet (75 mg total) by mouth daily as needed. For migraines. Take as close to onset of migraine as possible. One daily  maximum. 16 tablet 11   SUMAtriptan  (IMITREX ) 100 MG tablet Take 100 mg by mouth every 2 (two) hours as needed.     No facility-administered medications prior to visit.    PAST MEDICAL HISTORY: Past Medical History:  Diagnosis Date   Allergy    Anxiety    Depression    Hypertension    Migraines    Migraines     PAST SURGICAL HISTORY: Past Surgical History:  Procedure Laterality Date   C yst removal  10 yrs ago   left middle finger   Disc fusion  3 yrs ago   c5/c6   INCISION AND DRAINAGE ABSCESS     tonsil   TONSILLECTOMY  47yrs ago    FAMILY HISTORY: Family History  Problem Relation Age of Onset   Stroke Father 2   Heart attack Father    Hypertension Mother    Depression Mother    Anxiety disorder Mother    Depression Sister    Anxiety disorder Sister    Stroke Maternal Grandfather    Heart disease Maternal Grandmother    Breast cancer Maternal Grandmother    Colon cancer Neg Hx    Esophageal cancer Neg Hx    Rectal cancer Neg Hx    Stomach cancer Neg Hx  SOCIAL HISTORY: Social History   Socioeconomic History   Marital status: Married    Spouse name: Not on file   Number of children: Not on file   Years of education: Not on file   Highest education level: Not on file  Occupational History   Not on file  Tobacco Use   Smoking status: Former    Current packs/day: 0.00    Types: Cigarettes    Quit date: 70    Years since quitting: 28.0   Smokeless tobacco: Never  Vaping Use   Vaping status: Never Used  Substance and Sexual Activity   Alcohol use: Not Currently   Drug use: Never   Sexual activity: Yes  Other Topics Concern   Not on file  Social History Narrative   Not on file   Social Drivers of Health   Tobacco Use: Medium Risk (04/08/2024)   Patient History    Smoking Tobacco Use: Former    Smokeless Tobacco Use: Never    Passive Exposure: Not on Actuary Strain: Not on file  Food Insecurity: Not on file   Transportation Needs: Not on file  Physical Activity: Not on file  Stress: Not on file  Social Connections: Not on file  Intimate Partner Violence: Not on file  Depression (EYV7-0): Not on file  Alcohol Screen: Not on file  Housing: Not on file  Utilities: Not on file  Health Literacy: Not on file      PHYSICAL EXAM Generalized: Well developed, in no acute distress   Neurological examination  Mentation: Alert oriented to time, place, history taking. Follows all commands speech and language fluent Cranial nerve II-XII: Facial symmetry noted.   DIAGNOSTIC DATA (LABS, IMAGING, TESTING) - I reviewed patient records, labs, notes, testing and imaging myself where available.  Lab Results  Component Value Date   WBC 6.7 03/31/2021   HGB 13.1 03/31/2021   HCT 39.1 03/31/2021   MCV 92.7 03/31/2021   PLT 285 03/31/2021      Component Value Date/Time   NA 139 03/31/2021 1456   K 3.8 03/31/2021 1456   CL 103 03/31/2021 1456   CO2 27 03/31/2021 1456   GLUCOSE 92 03/31/2021 1456   BUN 17 03/31/2021 1456   CREATININE 0.85 03/31/2021 1456   CREATININE 0.80 11/14/2014 1518   CALCIUM 9.5 03/31/2021 1456   PROT 7.5 11/14/2014 1518   ALBUMIN 4.1 11/14/2014 1518   AST 26 11/14/2014 1518   ALT 21 11/14/2014 1518   ALKPHOS 56 11/14/2014 1518   BILITOT 0.3 11/14/2014 1518   GFRNONAA >60 03/31/2021 1456   No results found for: CHOL, HDL, LDLCALC, LDLDIRECT, TRIG, CHOLHDL No results found for: YHAJ8R No results found for: VITAMINB12 Lab Results  Component Value Date   TSH 0.836 11/14/2014      ASSESSMENT AND PLAN 54 y.o. year old female  has a past medical history of Allergy, Anxiety, Depression, Hypertension, Migraines, and Migraines. here with   Migraine headaches  Continue Emgality  monthly injection Continue Imitrex  for abortive therapy Advise if headache frequency or severity increases she should let us  know Follow-up in 1 year or sooner if needed  *  No order type specified * Meds ordered this encounter  Medications   SUMAtriptan  (IMITREX ) 100 MG tablet    Sig: Take 1 tablet at the onset of migraine. Can repeat 2 hours if needed. Only 2 tabs in 24 hours.    Dispense:  10 tablet    Refill:  11  Supervising Provider:   YAN, YIJUN [[id number]]   Galcanezumab -gnlm (EMGALITY ) 120 MG/ML SOSY    Sig: Inject 120 mg into the skin every 30 (thirty) days.    Dispense:  1.12 mL    Refill:  11    Supervising Provider:   YAN, YIJUN [[id number]]     Duwaine Russell, MSN, NP-C 11/28/2024, 11:08 AM [name] [address], [address] [place], [place] [address] [phone]  The patient's condition requires frequent monitoring and adjustments in the treatment plan, reflecting the ongoing complexity of care.  This provider is the continuing focal point for all needed services for this condition.     [1]  Allergies Allergen Reactions   Sulfa Antibiotics     Headaches    "

## 2025-11-24 ENCOUNTER — Telehealth: Payer: Self-pay | Admitting: Adult Health
